# Patient Record
Sex: Male | Born: 1965 | Race: White | Hispanic: No | Marital: Married | Smoking: Former smoker
Health system: Southern US, Community
[De-identification: ages and names within clinical notes are randomized; demographics above are authoritative.]

## PROBLEM LIST (undated history)

## (undated) DIAGNOSIS — J189 Pneumonia, unspecified organism: Secondary | ICD-10-CM

## (undated) DIAGNOSIS — M1990 Unspecified osteoarthritis, unspecified site: Secondary | ICD-10-CM

## (undated) DIAGNOSIS — I10 Essential (primary) hypertension: Secondary | ICD-10-CM

## (undated) DIAGNOSIS — E039 Hypothyroidism, unspecified: Secondary | ICD-10-CM

## (undated) HISTORY — PX: HAND TENDON SURGERY: SHX663

## (undated) HISTORY — PX: WISDOM TOOTH EXTRACTION: SHX21

---

## 2003-05-05 ENCOUNTER — Encounter: Payer: Self-pay | Admitting: Family Medicine

## 2003-05-05 ENCOUNTER — Ambulatory Visit (HOSPITAL_COMMUNITY): Admission: RE | Admit: 2003-05-05 | Discharge: 2003-05-05 | Payer: Self-pay | Admitting: Family Medicine

## 2004-07-19 ENCOUNTER — Emergency Department (HOSPITAL_COMMUNITY): Admission: EM | Admit: 2004-07-19 | Discharge: 2004-07-19 | Payer: Self-pay

## 2011-08-11 ENCOUNTER — Encounter: Payer: Self-pay | Admitting: Family Medicine

## 2011-08-11 ENCOUNTER — Emergency Department (HOSPITAL_BASED_OUTPATIENT_CLINIC_OR_DEPARTMENT_OTHER)
Admission: EM | Admit: 2011-08-11 | Discharge: 2011-08-11 | Disposition: A | Discharge status: Home / Self Care | Payer: 59 | Attending: Emergency Medicine | Admitting: Emergency Medicine

## 2011-08-11 ENCOUNTER — Emergency Department (INDEPENDENT_AMBULATORY_CARE_PROVIDER_SITE_OTHER): Payer: 59

## 2011-08-11 ENCOUNTER — Other Ambulatory Visit: Payer: Self-pay

## 2011-08-11 DIAGNOSIS — R002 Palpitations: Secondary | ICD-10-CM

## 2011-08-11 DIAGNOSIS — R05 Cough: Secondary | ICD-10-CM

## 2011-08-11 DIAGNOSIS — E079 Disorder of thyroid, unspecified: Secondary | ICD-10-CM | POA: Insufficient documentation

## 2011-08-11 DIAGNOSIS — I499 Cardiac arrhythmia, unspecified: Secondary | ICD-10-CM | POA: Insufficient documentation

## 2011-08-11 HISTORY — DX: Hypothyroidism, unspecified: E03.9

## 2011-08-11 LAB — BASIC METABOLIC PANEL
BUN: 12 mg/dL (ref 6–23)
CO2: 23 mEq/L (ref 19–32)
Calcium: 9.5 mg/dL (ref 8.4–10.5)
Chloride: 105 mEq/L (ref 96–112)
Creatinine, Ser: 0.8 mg/dL (ref 0.50–1.35)
GFR calc Af Amer: >90 mL/min (ref >90)
Glucose, Bld: 92 mg/dL (ref 70–99)
Potassium: 4.1 mEq/L (ref 3.5–5.1)
Sodium: 139 mEq/L (ref 135–145)

## 2011-08-11 NOTE — ED Provider Notes (Signed)
History     CSN: [id number] Arrival date & time: 08/11/2011  8:29 AM   First MD Initiated Contact with Patient 08/11/11 [phone]      Chief Complaint  Patient presents with  . Irregular Heart Beat    (Consider location/radiation/quality/duration/timing/severity/associated sxs/prior treatment) Patient is a 45 y.o. male presenting with palpitations.  Palpitations  This is a new problem. The current episode started yesterday. The problem occurs constantly. The problem has been rapidly improving. The problem is associated with an unknown factor. Associated symptoms include irregular heartbeat and cough. Pertinent negatives include no fever, no chest pain, no chest pressure, no orthopnea, no syncope, no nausea, no back pain, no leg pain, no shortness of breath and no sputum production. He has tried nothing for the symptoms. Risk factors include no known risk factors. Past medical history comments: hypothyroidism.    Past Medical History  Diagnosis Date  . Hypothyroid     History reviewed. No pertinent past surgical history.  No family history on file.  History  Substance Use Topics  . Smoking status: Never Smoker   . Smokeless tobacco: Not on file  . Alcohol Use: Yes      Review of Systems  Constitutional: Negative for fever.  Respiratory: Positive for cough. Negative for sputum production and shortness of breath.   Cardiovascular: Positive for palpitations. Negative for chest pain, orthopnea and syncope.  Gastrointestinal: Negative for nausea.  Musculoskeletal: Negative for back pain.  All other systems reviewed and are negative.    Allergies  Review of patient's allergies indicates no known allergies.  Home Medications   Current Outpatient Rx  Name Route Sig Dispense Refill  . LEVOTHYROXINE SODIUM 200 MCG PO TABS Oral Take 200 mcg by mouth daily.      Marland Kitchen ONE-DAILY MULTI VITAMINS PO TABS Oral Take 1 tablet by mouth daily.        BP 141/101  Pulse 82  Temp(Src) 98.5  F (36.9 C) (Oral)  Resp 16  Ht 5\' 8"  (1.727 m)  Wt 200 lb (90.719 kg)  BMI 30.41 kg/m2  SpO2 99%  Physical Exam  Nursing note and vitals reviewed. Constitutional: He is oriented to person, place, and time. He appears well-developed and well-nourished. No distress.  HENT:  Head: Normocephalic and atraumatic.  Mouth/Throat: Oropharynx is clear and moist.  Eyes: Conjunctivae and EOM are normal. Pupils are equal, round, and reactive to light.  Neck: Normal range of motion. Neck supple.  Cardiovascular: Normal rate, regular rhythm and intact distal pulses.   No murmur heard. Pulmonary/Chest: Effort normal and breath sounds normal. No respiratory distress. He has no wheezes. He has no rales.  Abdominal: Soft. He exhibits no distension. There is no tenderness. There is no rebound and no guarding.  Musculoskeletal: Normal range of motion. He exhibits no edema and no tenderness.  Neurological: He is alert and oriented to person, place, and time.  Skin: Skin is warm and dry. No rash noted. No erythema.  Psychiatric: He has a normal mood and affect. His behavior is normal.    ED Course  Procedures (including critical care time)   Labs Reviewed  CARDIAC PANEL(CRET KIN+CKTOT+MB+TROPI)  BASIC METABOLIC PANEL   Dg Chest 2 View  08/11/2011  *RADIOLOGY REPORT*  Clinical Data: Cough, palpitations  CHEST - 2 VIEW  Comparison: None.  Findings: No active infiltrate or effusion is seen.  Minimal peribronchial thickening is noted.  Mediastinal contours appear normal.  The heart is within normal limits in size.  No  bony abnormality is seen.  IMPRESSION: No active lung disease.  Minimal peribronchial thickening.  Original Report Authenticated By: Juline Patch, M.D.     No diagnosis found.   Date: 08/11/2011  Rate: 77  Rhythm: normal sinus rhythm  QRS Axis: normal  Intervals: normal  ST/T Wave abnormalities: normal  Conduction Disutrbances:none  Narrative Interpretation:   Old EKG Reviewed:  none available     MDM   Pt with sensation of irregular heart beat since last night.  States earlier his heart was also beating very fast but now just still feels irregular.  Pt denies any CP, SOB or other associated sx.  States had URI over a week ago and now just has a very mild cough.  Denies excessive caffiene use or drug use.  Denies cold medication use and synthroid at same dose for the last 1year.  However TSH last checked 81mo ago.  Here VS normal and exam normal.  Pulse is regular and EKG wnl without any signs of dysrrhythmia.  No cardiac risk factors and pt o/w well appearing.  Will check BMP, CE and CXR to r/o other issues.  However discussed with pt f/u with PCP for holter monitor and TSH recheck.  Labs wnl.  While on monitor no arrhythmias      Gwyneth Sprout, MD 08/11/11 1014

## 2011-08-11 NOTE — ED Notes (Signed)
MD at bedside. 

## 2011-08-11 NOTE — ED Notes (Signed)
Pt sts he feels "good'.

## 2011-08-11 NOTE — ED Notes (Signed)
Pt sts he was watching tv and "heart felt irregular". Pt sts rate "felt normal but just irregular". Pt sts "it still feels irregular".  Pt denies hx of same. Pt denies pain, shob, n/v, diaphoresis.

## 2015-06-11 ENCOUNTER — Encounter (HOSPITAL_BASED_OUTPATIENT_CLINIC_OR_DEPARTMENT_OTHER): Payer: Self-pay | Admitting: *Deleted

## 2015-06-11 ENCOUNTER — Emergency Department (HOSPITAL_BASED_OUTPATIENT_CLINIC_OR_DEPARTMENT_OTHER): Payer: BLUE CROSS/BLUE SHIELD

## 2015-06-11 ENCOUNTER — Emergency Department (HOSPITAL_BASED_OUTPATIENT_CLINIC_OR_DEPARTMENT_OTHER)
Admission: EM | Admit: 2015-06-11 | Discharge: 2015-06-11 | Disposition: A | Discharge status: Home / Self Care | Payer: BLUE CROSS/BLUE SHIELD | Attending: Emergency Medicine | Admitting: Emergency Medicine

## 2015-06-11 DIAGNOSIS — S8992XA Unspecified injury of left lower leg, initial encounter: Secondary | ICD-10-CM | POA: Diagnosis present

## 2015-06-11 DIAGNOSIS — E039 Hypothyroidism, unspecified: Secondary | ICD-10-CM | POA: Insufficient documentation

## 2015-06-11 DIAGNOSIS — W228XXA Striking against or struck by other objects, initial encounter: Secondary | ICD-10-CM | POA: Insufficient documentation

## 2015-06-11 DIAGNOSIS — S82092A Other fracture of left patella, initial encounter for closed fracture: Secondary | ICD-10-CM | POA: Insufficient documentation

## 2015-06-11 DIAGNOSIS — S76112A Strain of left quadriceps muscle, fascia and tendon, initial encounter: Secondary | ICD-10-CM | POA: Diagnosis not present

## 2015-06-11 DIAGNOSIS — Y9289 Other specified places as the place of occurrence of the external cause: Secondary | ICD-10-CM | POA: Insufficient documentation

## 2015-06-11 DIAGNOSIS — Z23 Encounter for immunization: Secondary | ICD-10-CM | POA: Diagnosis not present

## 2015-06-11 DIAGNOSIS — Z79899 Other long term (current) drug therapy: Secondary | ICD-10-CM | POA: Diagnosis not present

## 2015-06-11 DIAGNOSIS — S82002A Unspecified fracture of left patella, initial encounter for closed fracture: Secondary | ICD-10-CM

## 2015-06-11 DIAGNOSIS — Y998 Other external cause status: Secondary | ICD-10-CM | POA: Diagnosis not present

## 2015-06-11 DIAGNOSIS — Y9339 Activity, other involving climbing, rappelling and jumping off: Secondary | ICD-10-CM | POA: Diagnosis not present

## 2015-06-11 MED ORDER — TETANUS-DIPHTH-ACELL PERTUSSIS 5-2.5-18.5 LF-MCG/0.5 IM SUSP
0.5000 mL | Freq: Once | INTRAMUSCULAR | Status: AC
  Administered 2015-06-11: 0.5 mL via INTRAMUSCULAR
  Filled 2015-06-11: qty 0.5

## 2015-06-11 MED ORDER — HYDROCODONE-ACETAMINOPHEN 5-325 MG PO TABS: 1.0000 | ORAL_TABLET | ORAL | Status: DC | PRN

## 2015-06-11 NOTE — Discharge Instructions (Signed)
Patellar Fracture, Adult Call Dr. Lajoyce Corners for an appointemnt tomorrow. Return to the ED if you develop new or worsening symptoms. A patellar fracture is a break in your kneecap (patella).  CAUSES   A direct blow to the knee or a fall is usually the cause of a broken patella.  A very hard and strong bending of your knee can cause a patellar fracture. RISK FACTORS Involvement in contact sports, especially sports that involve a lot of jumping. SIGNS AND SYMPTOMS   Tender and swollen knee.  Pain when you move your knee, especially when you try to straighten out your leg.  Difficulty walking or putting weight on your knee.  Misshapen knee (as if a bone is out of place). DIAGNOSIS  Patellar fracture is usually diagnosed with a physical exam and an X-ray exam. TREATMENT  Treatment depends on the type of fracture:  If your patella is still in the right position after the fracture and you can still straighten your leg out, you can usually be treated with a splint or cast for 4-6 weeks.  If your patella is broken into multiple small pieces but you are able to straighten your leg, you can usually be treated with a splint or cast for 4-6 weeks. Sometimes your patella may need to be removed before the cast is applied.  If you cannot straighten out your leg after a patellar fracture, then surgery is required to hold the bony fragments together until they heal. A cast or splint will be applied for 4-6 weeks. HOME CARE INSTRUCTIONS   Only take over-the-counter or prescription medicines for pain, discomfort, or fever as directed by your health care provider.  Use crutches as directed, and exercise the leg as directed.  Apply ice to the injured area:  Put ice in a plastic bag.  Place a towel between your skin and the bag.  Leave the ice on for 20 minutes, 2-3 times a day.  Elevate the affected knee above the level of your heart. SEEK MEDICAL CARE IF:  You suspect you have significantly  injured your knee.  You hear a pop after a knee injury.  Your knee is misshapen after a knee injury.  You have pain when you move your knee.  You have difficulty walking or putting weight on your knee.  You cannot fully move your knee. SEEK IMMEDIATE MEDICAL CARE IF:  You have redness, swelling, or increasing pain in your knee.  You have a fever. Document Released: 06/21/2003 Document Revised: 07/13/2013 Document Reviewed: 05/04/2013 [facility] Patient Information 2015 [place], [place]. This information is not intended to replace advice given to you by your health care provider. Make sure you discuss any questions you have with your health care provider.

## 2015-06-11 NOTE — ED Notes (Signed)
MD at bedside. 

## 2015-06-11 NOTE — ED Provider Notes (Signed)
CSN: [id number]     Arrival date & time 06/11/15  1553 History  This chart was scribed for Tyler Octave, MD by Murriel Hopper, ED Scribe. This patient was seen in room MH10/MH10 and the patient's care was started at 4:19 PM.    Chief Complaint  Patient presents with  . Knee Injury     The history is provided by the patient. No language interpreter was used.     HPI Comments: Tyler [name] is a 49 y.o. male who presents to the Emergency Department complaining of a knee injury that occurred immediately PTA. Pt states he was jumping off of a diving board, and as he came back down for his second jump on the end of the board, his left leg gave out and he hit his left shin on the end of the board as he went into the water. Pt reports with a bleeding laceration on his left shin currently. Pt states he had to be helped out from the water by a lifeguard, and states when he tried to walk when he was on land his left knee buckled. Pt denies current left knee pain, denies hitting his head, or LOC during the jump.   Past Medical History  Diagnosis Date  . Hypothyroid    History reviewed. No pertinent past surgical history. No family history on file. Social History  Substance Use Topics  . Smoking status: Never Smoker   . Smokeless tobacco: Never Used  . Alcohol Use: Yes     Comment: 6 drinks/week    Review of Systems  A complete 10 system review of systems was obtained and all systems are negative except as noted in the HPI and PMH.    Allergies  Review of patient's allergies indicates no known allergies.  Home Medications   Prior to Admission medications   Medication Sig Start Date End Date Taking? Authorizing Provider  levothyroxine (SYNTHROID, LEVOTHROID) 200 MCG tablet Take 200 mcg by mouth daily.     Yes Historical Provider, MD  HYDROcodone-acetaminophen (NORCO/VICODIN) 5-325 MG per tablet Take 1 tablet by mouth every 4 (four) hours as needed. 06/11/15   Tyler Octave, MD   Multiple Vitamin (MULTIVITAMIN) tablet Take 1 tablet by mouth daily.      Historical Provider, MD   BP 96/64 mmHg  Pulse 70  Temp(Src) 97.7 F (36.5 C) (Oral)  Resp 18  Ht 5\' 8"  (1.727 m)  Wt 210 lb (95.255 kg)  BMI 31.94 kg/m2  SpO2 95% Physical Exam  Constitutional: He is oriented to person, place, and time. He appears well-developed and well-nourished. No distress.  HENT:  Head: Normocephalic and atraumatic.  Mouth/Throat: Oropharynx is clear and moist. No oropharyngeal exudate.  Eyes: Conjunctivae and EOM are normal. Pupils are equal, round, and reactive to light.  Neck: Normal range of motion. Neck supple.  No meningismus.  Cardiovascular: Normal rate, regular rhythm, normal heart sounds and intact distal pulses.   No murmur heard. Pulmonary/Chest: Effort normal and breath sounds normal. No respiratory distress.  Abdominal: Soft. There is no tenderness. There is no rebound and no guarding.  Musculoskeletal: Normal range of motion. He exhibits no edema or tenderness.  Palpable deformity to quadriceps tendon Unable to straight leg raise Weakness with knee extension Puncture wound and abrasion to left shin Intact DP and PT pulses  Neurological: He is alert and oriented to person, place, and time. No cranial nerve deficit. He exhibits normal muscle tone. Coordination normal.  No ataxia on finger to  nose bilaterally. No pronator drift. 5/5 strength throughout. CN 2-12 intact. Equal grip strength. Sensation intact.   Skin: Skin is warm.  Psychiatric: He has a normal mood and affect. His behavior is normal.  Nursing note and vitals reviewed.   ED Course  Procedures (including critical care time)  DIAGNOSTIC STUDIES: Oxygen Saturation is 95% on room air, normal by my interpretation.    COORDINATION OF CARE: 4:25 PM Discussed treatment plan with pt at bedside and pt agreed to plan.  5:55 PM X-rays read, and results discussed with pt. Pt referred to orthopedic specialist  Dr. Lajoyce Corners. Pt will be scheduled for office visit and surgery tomorrow morning when he calls their office.   Labs Review Labs Reviewed - No data to display  Imaging Review Dg Tibia/fibula Left  06/11/2015   ADDENDUM REPORT: 06/11/2015 17:44  ADDENDUM: Not mentioned above is a partially visualized well corticated osseous fragment along the superolateral aspect of the patella likely reflecting sequela prior avulsive injury.   Electronically Signed   By: Elige Ko   On: 06/11/2015 17:44   06/11/2015   CLINICAL DATA:  Left lower leg pain with anterior laceration midway down the leg  EXAM: LEFT TIBIA AND FIBULA - 2 VIEW  COMPARISON:  None.  FINDINGS: There is no evidence of fracture or other focal bone lesions. Soft tissues are unremarkable.  IMPRESSION: No acute osseous injury of the left lower leg.  Electronically Signed: By: Elige Ko On: 06/11/2015 17:05   Dg Knee Complete 4 Views Left  06/11/2015   CLINICAL DATA:  Patient was jumping on a diving board and came down on the left knee. Knee gave out. Anterior pain. Unable to bear weight or bend knee. History of anterior knee surgery 10 years ago.  EXAM: LEFT KNEE - COMPLETE 4+ VIEW  COMPARISON:  06/11/2015 tib-fib  FINDINGS: There is a chronic fracture of the superior pole of the patella with proximal displacement of the proximal fracture fragment. There is edema within the anterior aspect of the knee. Small joint effusion is present. No evidence for acute fracture or dislocation.  IMPRESSION: Chronic fracture the patella.  No evidence for acute  abnormality.   Electronically Signed   By: Norva Pavlov M.D.   On: 06/11/2015 17:36   I have personally reviewed and evaluated these images and lab results as part of my medical decision-making.   EKG Interpretation None      MDM   Final diagnoses:  Patella fracture, left, closed, initial encounter  Quadriceps tendon rupture, left, initial encounter   patient slipped on diving board and landed on  left knee. Did not hit head or lose consciousness.  Patient on exam has quadriceps tendon rupture with abrasion to lower leg. Neurovascularly intact.  X-ray shows patellar fracture. Read by radiology as chronic but patient denies any previous injury. Patella fracture with posterior minus quadriceps tendon rupture.  Discussed with Dr. Lajoyce Corners orthopedics. He states treatment is the same whether there is tendon rupture or just patellar rupture. Knee immobilizer, crutches, weightbearing as tolerated. Patient to call his office in the morning to schedule an office visit for surgical repair.  I personally performed the services described in this documentation, which was scribed in my presence. The recorded information has been reviewed and is accurate.   Tyler Octave, MD 06/11/15 [phone]

## 2015-06-11 NOTE — ED Notes (Signed)
Rx x 1 given for vicodin. D/c home with family to drive. Verbalizes understanding to call Dr Lajoyce Corners in the morning for an appt tomorrow. Crutches and ice pack given for home use

## 2015-06-11 NOTE — ED Notes (Signed)
EMS report patient was diving on a board and his leg slipped off of the board, causing his left leg to buckle, left shin hit the diving board causing him to hit his shin on the board. No loc.   IV # 18 G in left AC and was given of Fentanyl, ice applied to knee, VS: HR 80, BP 113/76, RR 16.  Pt consumed 4 12oz beers pta.

## 2015-06-12 ENCOUNTER — Other Ambulatory Visit (HOSPITAL_COMMUNITY): Payer: Self-pay | Admitting: Orthopedic Surgery

## 2015-06-12 ENCOUNTER — Other Ambulatory Visit (HOSPITAL_COMMUNITY): Payer: Self-pay | Admitting: *Deleted

## 2015-06-12 ENCOUNTER — Encounter (HOSPITAL_COMMUNITY): Payer: Self-pay | Admitting: *Deleted

## 2015-06-13 ENCOUNTER — Ambulatory Visit (HOSPITAL_COMMUNITY)
Admission: RE | Admit: 2015-06-13 | Discharge: 2015-06-13 | Disposition: A | Discharge status: Home / Self Care | Payer: BLUE CROSS/BLUE SHIELD | Source: Ambulatory Visit | Attending: Orthopedic Surgery | Admitting: Orthopedic Surgery

## 2015-06-13 ENCOUNTER — Ambulatory Visit (HOSPITAL_COMMUNITY): Payer: BLUE CROSS/BLUE SHIELD | Admitting: Certified Registered Nurse Anesthetist

## 2015-06-13 ENCOUNTER — Encounter (HOSPITAL_COMMUNITY)
Admission: RE | Disposition: A | Discharge status: Home / Self Care | Payer: Self-pay | Source: Ambulatory Visit | Attending: Orthopedic Surgery

## 2015-06-13 DIAGNOSIS — X58XXXA Exposure to other specified factors, initial encounter: Secondary | ICD-10-CM | POA: Diagnosis not present

## 2015-06-13 DIAGNOSIS — E039 Hypothyroidism, unspecified: Secondary | ICD-10-CM | POA: Insufficient documentation

## 2015-06-13 DIAGNOSIS — Y9339 Activity, other involving climbing, rappelling and jumping off: Secondary | ICD-10-CM | POA: Diagnosis not present

## 2015-06-13 DIAGNOSIS — Y92838 Other recreation area as the place of occurrence of the external cause: Secondary | ICD-10-CM | POA: Insufficient documentation

## 2015-06-13 DIAGNOSIS — S82002A Unspecified fracture of left patella, initial encounter for closed fracture: Secondary | ICD-10-CM | POA: Insufficient documentation

## 2015-06-13 DIAGNOSIS — Z87891 Personal history of nicotine dependence: Secondary | ICD-10-CM | POA: Diagnosis not present

## 2015-06-13 DIAGNOSIS — S76112A Strain of left quadriceps muscle, fascia and tendon, initial encounter: Secondary | ICD-10-CM | POA: Diagnosis present

## 2015-06-13 DIAGNOSIS — W228XXA Striking against or struck by other objects, initial encounter: Secondary | ICD-10-CM | POA: Diagnosis not present

## 2015-06-13 HISTORY — DX: Pneumonia, unspecified organism: J18.9

## 2015-06-13 HISTORY — PX: QUADRICEPS TENDON REPAIR: SHX756

## 2015-06-13 LAB — CBC
HCT: 41.5 % (ref 39.0–52.0)
HEMOGLOBIN: 13.9 g/dL (ref 13.0–17.0)
MCH: 30.6 pg (ref 26.0–34.0)
MCHC: 33.5 g/dL (ref 30.0–36.0)
MCV: 91.4 fL (ref 78.0–100.0)
Platelets: 194 10*3/uL (ref 150–400)
RBC: 4.54 MIL/uL (ref 4.22–5.81)
RDW: 13 % (ref 11.5–15.5)
WBC: 5.8 10*3/uL (ref 4.0–10.5)

## 2015-06-13 LAB — COMPREHENSIVE METABOLIC PANEL
ALBUMIN: 3.6 g/dL (ref 3.5–5.0)
ALT: 34 U/L (ref 17–63)
ANION GAP: 9 (ref 5–15)
AST: 30 U/L (ref 15–41)
Alkaline Phosphatase: 44 U/L (ref 38–126)
BUN: 9 mg/dL (ref 6–20)
CHLORIDE: 104 mmol/L (ref 101–111)
CO2: 24 mmol/L (ref 22–32)
Calcium: 9 mg/dL (ref 8.9–10.3)
Creatinine, Ser: 0.89 mg/dL (ref 0.61–1.24)
GFR calc Af Amer: >60 mL/min (ref >60)
GFR calc non Af Amer: >60 mL/min (ref >60)
GLUCOSE: 85 mg/dL (ref 65–99)
POTASSIUM: 3.7 mmol/L (ref 3.5–5.1)
SODIUM: 137 mmol/L (ref 135–145)
Total Bilirubin: 1.2 mg/dL (ref 0.3–1.2)
Total Protein: 6.5 g/dL (ref 6.5–8.1)

## 2015-06-13 LAB — APTT: APTT: 25 s (ref 24–37)

## 2015-06-13 LAB — PROTIME-INR
INR: 0.95 (ref 0.00–1.49)
Prothrombin Time: 12.9 seconds (ref 11.6–15.2)

## 2015-06-13 SURGERY — REPAIR, TENDON, QUADRICEPS
Anesthesia: Regional | Site: Thigh | Laterality: Left

## 2015-06-13 MED ORDER — LACTATED RINGERS IV SOLN
INTRAVENOUS | Status: DC | PRN
  Administered 2015-06-13: 17:00:00 via INTRAVENOUS

## 2015-06-13 MED ORDER — CHLORHEXIDINE GLUCONATE 4 % EX LIQD: 60.0000 mL | Freq: Once | CUTANEOUS | Status: DC

## 2015-06-13 MED ORDER — FENTANYL CITRATE (PF) 250 MCG/5ML IJ SOLN
INTRAMUSCULAR | Status: AC
  Filled 2015-06-13: qty 5

## 2015-06-13 MED ORDER — MIDAZOLAM HCL 2 MG/2ML IJ SOLN
INTRAMUSCULAR | Status: AC
  Administered 2015-06-13: 2 mg
  Filled 2015-06-13: qty 2

## 2015-06-13 MED ORDER — FENTANYL CITRATE (PF) 100 MCG/2ML IJ SOLN
100.0000 ug | Freq: Once | INTRAMUSCULAR | Status: AC
  Administered 2015-06-13: 100 ug via INTRAVENOUS

## 2015-06-13 MED ORDER — BUPIVACAINE-EPINEPHRINE (PF) 0.5% -1:200000 IJ SOLN
INTRAMUSCULAR | Status: DC | PRN
  Administered 2015-06-13: 30 mL via PERINEURAL

## 2015-06-13 MED ORDER — PROMETHAZINE HCL 25 MG/ML IJ SOLN: 6.2500 mg | INTRAMUSCULAR | Status: DC | PRN

## 2015-06-13 MED ORDER — HYDROMORPHONE HCL 1 MG/ML IJ SOLN: 0.2500 mg | INTRAMUSCULAR | Status: DC | PRN

## 2015-06-13 MED ORDER — MIDAZOLAM HCL 5 MG/ML IJ SOLN: 2.0000 mg | Freq: Once | INTRAMUSCULAR | Status: AC

## 2015-06-13 MED ORDER — CEFAZOLIN SODIUM-DEXTROSE 2-3 GM-% IV SOLR
2.0000 g | INTRAVENOUS | Status: AC
  Administered 2015-06-13: 2 g via INTRAVENOUS
  Filled 2015-06-13: qty 50

## 2015-06-13 MED ORDER — FENTANYL CITRATE (PF) 100 MCG/2ML IJ SOLN
INTRAMUSCULAR | Status: DC | PRN
  Administered 2015-06-13: 50 ug via INTRAVENOUS

## 2015-06-13 MED ORDER — PROPOFOL 10 MG/ML IV BOLUS
INTRAVENOUS | Status: DC | PRN
  Administered 2015-06-13 (×2): 50 mg via INTRAVENOUS
  Administered 2015-06-13: 100 mg via INTRAVENOUS

## 2015-06-13 MED ORDER — ONDANSETRON HCL 4 MG/2ML IJ SOLN
INTRAMUSCULAR | Status: DC | PRN
  Administered 2015-06-13: 4 mg via INTRAVENOUS

## 2015-06-13 MED ORDER — PROPOFOL 10 MG/ML IV BOLUS
INTRAVENOUS | Status: AC
  Filled 2015-06-13: qty 20

## 2015-06-13 MED ORDER — FENTANYL CITRATE (PF) 100 MCG/2ML IJ SOLN
INTRAMUSCULAR | Status: AC
  Administered 2015-06-13: 100 ug via INTRAVENOUS
  Filled 2015-06-13: qty 2

## 2015-06-13 MED ORDER — MIDAZOLAM HCL 2 MG/2ML IJ SOLN
INTRAMUSCULAR | Status: AC
  Filled 2015-06-13: qty 4

## 2015-06-13 MED ORDER — MEPERIDINE HCL 25 MG/ML IJ SOLN: 6.2500 mg | INTRAMUSCULAR | Status: DC | PRN

## 2015-06-13 MED ORDER — 0.9 % SODIUM CHLORIDE (POUR BTL) OPTIME
TOPICAL | Status: DC | PRN
  Administered 2015-06-13: 1000 mL

## 2015-06-13 SURGICAL SUPPLY — 50 items
BLADE SURG 10 STRL SS (BLADE) ×3 IMPLANT
BNDG COHESIVE 4X5 TAN STRL (GAUZE/BANDAGES/DRESSINGS) ×3 IMPLANT
BNDG GAUZE ELAST 4 BULKY (GAUZE/BANDAGES/DRESSINGS) ×3 IMPLANT
COVER MAYO STAND STRL (DRAPES) ×1 IMPLANT
COVER SURGICAL LIGHT HANDLE (MISCELLANEOUS) ×3 IMPLANT
DRAPE INCISE IOBAN 66X45 STRL (DRAPES) ×3 IMPLANT
DRAPE U-SHAPE 47X51 STRL (DRAPES) ×3 IMPLANT
DRSG ADAPTIC 3X8 NADH LF (GAUZE/BANDAGES/DRESSINGS) ×3 IMPLANT
DRSG PAD ABDOMINAL 8X10 ST (GAUZE/BANDAGES/DRESSINGS) ×4 IMPLANT
DURAPREP 26ML APPLICATOR (WOUND CARE) ×3 IMPLANT
ELECT REM PT RETURN 9FT ADLT (ELECTROSURGICAL) ×3
ELECTRODE REM PT RTRN 9FT ADLT (ELECTROSURGICAL) ×1 IMPLANT
GAUZE SPONGE 4X4 12PLY STRL (GAUZE/BANDAGES/DRESSINGS) ×3 IMPLANT
GLOVE BIOGEL PI IND STRL 6.5 (GLOVE) IMPLANT
GLOVE BIOGEL PI IND STRL 7.5 (GLOVE) IMPLANT
GLOVE BIOGEL PI IND STRL 8.5 (GLOVE) ×1 IMPLANT
GLOVE BIOGEL PI INDICATOR 6.5 (GLOVE) ×2
GLOVE BIOGEL PI INDICATOR 7.5 (GLOVE) ×2
GLOVE BIOGEL PI INDICATOR 8.5 (GLOVE) ×4
GLOVE SURG ORTHO 9.0 STRL STRW (GLOVE) ×3 IMPLANT
GLOVE SURG SS PI 6.5 STRL IVOR (GLOVE) ×4 IMPLANT
GOWN STRL REUS W/ TWL LRG LVL3 (GOWN DISPOSABLE) IMPLANT
GOWN STRL REUS W/ TWL XL LVL3 (GOWN DISPOSABLE) ×1 IMPLANT
GOWN STRL REUS W/TWL LRG LVL3 (GOWN DISPOSABLE) ×6
GOWN STRL REUS W/TWL XL LVL3 (GOWN DISPOSABLE) ×3
KIT BASIN OR (CUSTOM PROCEDURE TRAY) ×3 IMPLANT
KIT ROOM TURNOVER OR (KITS) ×3 IMPLANT
MANIFOLD NEPTUNE II (INSTRUMENTS) ×3 IMPLANT
NDL SUT 2 .5 CRC MAYO 1.732X (NEEDLE) IMPLANT
NEEDLE MAYO TAPER (NEEDLE) ×3
NS IRRIG 1000ML POUR BTL (IV SOLUTION) ×3 IMPLANT
PACK ORTHO EXTREMITY (CUSTOM PROCEDURE TRAY) ×3 IMPLANT
PAD ARMBOARD 7.5X6 YLW CONV (MISCELLANEOUS) ×6 IMPLANT
RETRIEVER SUT HEWSON (MISCELLANEOUS) ×3 IMPLANT
SPONGE GAUZE 4X4 12PLY STER LF (GAUZE/BANDAGES/DRESSINGS) ×2 IMPLANT
SPONGE LAP 18X18 X RAY DECT (DISPOSABLE) ×6 IMPLANT
STAPLER VISISTAT 35W (STAPLE) ×3 IMPLANT
STOCKINETTE IMPERVIOUS 9X36 MD (GAUZE/BANDAGES/DRESSINGS) ×5 IMPLANT
SUT ETHILON 2 0 FS 18 (SUTURE) ×2 IMPLANT
SUT ETHILON 2 0 PSLX (SUTURE) ×2 IMPLANT
SUT FIBERWIRE #2 38 T-5 BLUE (SUTURE) ×9
SUT VIC AB 0 CT1 27 (SUTURE) ×3
SUT VIC AB 0 CT1 27XBRD ANBCTR (SUTURE) ×1 IMPLANT
SUTURE FIBERWR #2 38 T-5 BLUE (SUTURE) ×2 IMPLANT
TOWEL OR 17X24 6PK STRL BLUE (TOWEL DISPOSABLE) ×3 IMPLANT
TOWEL OR 17X26 10 PK STRL BLUE (TOWEL DISPOSABLE) ×3 IMPLANT
TUBE CONNECTING 12'X1/4 (SUCTIONS) ×1
TUBE CONNECTING 12X1/4 (SUCTIONS) ×2 IMPLANT
TUBE ENDOTRAC EMG 7X10.2 (MISCELLANEOUS) ×3 IMPLANT
YANKAUER SUCT BULB TIP NO VENT (SUCTIONS) ×3 IMPLANT

## 2015-06-13 NOTE — Anesthesia Preprocedure Evaluation (Signed)
Anesthesia Evaluation  Patient identified by MRN, date of birth, ID band Patient awake    Reviewed: Allergy & Precautions, NPO status , Patient's Chart, lab work & pertinent test results  Airway Mallampati: II  TM Distance: >3 FB Neck ROM: Full    Dental no notable dental hx.    Pulmonary neg pulmonary ROS, former smoker,    Pulmonary exam normal breath sounds clear to auscultation       Cardiovascular negative cardio ROS Normal cardiovascular exam Rhythm:Regular Rate:Normal     Neuro/Psych negative neurological ROS  negative psych ROS   GI/Hepatic negative GI ROS, Neg liver ROS,   Endo/Other  Hypothyroidism   Renal/GU negative Renal ROS  negative genitourinary   Musculoskeletal negative musculoskeletal ROS (+)   Abdominal   Peds negative pediatric ROS (+)  Hematology negative hematology ROS (+)   Anesthesia Other Findings   Reproductive/Obstetrics negative OB ROS                             Anesthesia Physical Anesthesia Plan  ASA: II  Anesthesia Plan: General and Regional   Post-op Pain Management:    Induction: Intravenous  Airway Management Planned: LMA  Additional Equipment:   Intra-op Plan:   Post-operative Plan: Extubation in OR  Informed Consent: I have reviewed the patients History and Physical, chart, labs and discussed the procedure including the risks, benefits and alternatives for the proposed anesthesia with the patient or authorized representative who has indicated his/her understanding and acceptance.   Dental advisory given  Plan Discussed with: CRNA  Anesthesia Plan Comments:         Anesthesia Quick Evaluation

## 2015-06-13 NOTE — H&P (Signed)
Tyler [name] is an 49 y.o. male.   Chief Complaint: Left quad tendon rupture with a avulsion of a secondary ossification center of the patella HPI: Patient is a 49 year old gentleman who sustained an acute quadriceps tendon rupture with avulsion of the secondary ossification center of the left patella  Past Medical History  Diagnosis Date  . Hypothyroid   . Pneumonia     Past Surgical History  Procedure Laterality Date  . Hand tendon surgery Left     Family History  Problem Relation Age of Onset  . Heart attack Father    Social History:  reports that he quit smoking about 20 years ago. He has never used smokeless tobacco. He reports that he drinks alcohol. He reports that he does not use illicit drugs.  Allergies: No Known Allergies  No prescriptions prior to admission    No results found for this or any previous visit (from the past 48 hour(s)). Dg Tibia/fibula Left  06/11/2015   ADDENDUM REPORT: 06/11/2015 17:44  ADDENDUM: Not mentioned above is a partially visualized well corticated osseous fragment along the superolateral aspect of the patella likely reflecting sequela prior avulsive injury.   Electronically Signed   By: Tyler [name]   On: 06/11/2015 17:44   06/11/2015   CLINICAL DATA:  Left lower leg pain with anterior laceration midway down the leg  EXAM: LEFT TIBIA AND FIBULA - 2 VIEW  COMPARISON:  None.  FINDINGS: There is no evidence of fracture or other focal bone lesions. Soft tissues are unremarkable.  IMPRESSION: No acute osseous injury of the left lower leg.  Electronically Signed: By: Tyler [name] On: 06/11/2015 17:05   Dg Knee Complete 4 Views Left  06/11/2015   CLINICAL DATA:  Patient was jumping on a diving board and came down on the left knee. Knee gave out. Anterior pain. Unable to bear weight or bend knee. History of anterior knee surgery 10 years ago.  EXAM: LEFT KNEE - COMPLETE 4+ VIEW  COMPARISON:  06/11/2015 tib-fib  FINDINGS: There is a chronic fracture  of the superior pole of the patella with proximal displacement of the proximal fracture fragment. There is edema within the anterior aspect of the knee. Small joint effusion is present. No evidence for acute fracture or dislocation.  IMPRESSION: Chronic fracture the patella.  No evidence for acute  abnormality.   Electronically Signed   By: Tyler [name] M.D.   On: 06/11/2015 17:36    Review of Systems  All other systems reviewed and are negative.   There were no vitals taken for this visit. Physical Exam  On examination patient has a palpable defect he cannot do an extension of his left knee actively. Radiographs shows avulsion of the secondary ossification center the patella. Assessment/Plan Assessment left quadriceps tendon rupture with avulsion secondary ossification center the patella.  Plan: We'll plan for excision of the secondary ossification center advancement and reconstruction of the quad tendon. Risk and benefits were discussed including risk of rerupture arthritis need for additional surgery including risk of infection. Patient states he understands and wishes to proceed at this time.  [name],[name] 06/13/2015, 6:46 AM

## 2015-06-13 NOTE — Anesthesia Procedure Notes (Addendum)
Anesthesia Regional Block:  Femoral nerve block  Pre-Anesthetic Checklist: ,, timeout performed, Correct Patient, Correct Site, Correct Laterality, Correct Procedure, Correct Position, site marked, Risks and benefits discussed, Surgical consent,  Pre-op evaluation,  Post-op pain management  Laterality: Left  Prep: chloraprep       Needles:  Injection technique: Single-shot  Needle Type: Stimulator Needle - 80     Needle Length: 9cm 9 cm Needle Gauge: 22 and 22 G  Needle insertion depth: 6 cm   Additional Needles:  Procedures: ultrasound guided (picture in chart) Femoral nerve block Narrative:  Injection made incrementally with aspirations every 5 mL.  Performed by: Personally  Anesthesiologist: Lewie Loron  Additional Notes: BP cuff, EKG monitors applied. Sedation begun. Femoral artery palpated for location of nerve. After nerve location verified with U/S, anesthetic injected incrementally, slowly, and after negative aspirations under direct u/s guidance. Good perineural spread. Patient tolerated well.   Procedure Name: LMA Insertion Date/Time: 06/13/2015 5:35 PM Performed by: Adonis Housekeeper Pre-anesthesia Checklist: Patient identified, Emergency Drugs available, Suction available and Patient being monitored Patient Re-evaluated:Patient Re-evaluated prior to inductionOxygen Delivery Method: Circle system utilized LMA: LMA inserted LMA Size: 4.0 Number of attempts: 1 Placement Confirmation: positive ETCO2 and breath sounds checked- equal and bilateral Tube secured with: Tape Dental Injury: Teeth and Oropharynx as per pre-operative assessment

## 2015-06-13 NOTE — Transfer of Care (Signed)
Immediate Anesthesia Transfer of Care Note  Patient: Tyler [name]  Procedure(s) Performed: Procedure(s): REPAIR LEFT QUADRICEP TENDON (Left)  Patient Location: PACU  Anesthesia Type:General and Regional  Level of Consciousness: awake, alert , oriented and patient cooperative  Airway & Oxygen Therapy: Patient Spontanous Breathing and Patient connected to nasal cannula oxygen  Post-op Assessment: Report given to RN, Post -op Vital signs reviewed and stable, Patient moving all extremities, Patient moving all extremities X 4 and Patient able to stick tongue midline  Post vital signs: Reviewed and stable  Last Vitals:  Filed Vitals:   06/13/15 1640  BP: 111/61  Pulse: 66  Temp:   Resp: 18    Complications: No apparent anesthesia complications

## 2015-06-13 NOTE — Op Note (Signed)
06/13/2015  5:56 PM  PATIENT:  Tyler [name]    PRE-OPERATIVE DIAGNOSIS:  Left Quadriceps rupture  POST-OPERATIVE DIAGNOSIS:  Same  PROCEDURE:  REPAIR LEFT QUADRICEP TENDON  SURGEON:  Nadara Mustard, MD  PHYSICIAN ASSISTANT:None ANESTHESIA:   General  PREOPERATIVE INDICATIONS:  Tyler [name] is a  49 y.o. male with a diagnosis of Left Quadriceps rupture who failed conservative measures and elected for surgical management.    The risks benefits and alternatives were discussed with the patient preoperatively including but not limited to the risks of infection, bleeding, nerve injury, cardiopulmonary complications, the need for revision surgery, among others, and the patient was willing to proceed.  OPERATIVE IMPLANTS: #2 FiberWire 4  OPERATIVE FINDINGS: Avulsion of a secondary ossification center which was preserved and reattached  OPERATIVE PROCEDURE: Patient was brought to the operating room after undergoing a femoral block. After adequate levels anesthesia obtained patient's left lower extremity was prepped using DuraPrep draped into a sterile field. A timeout was called. A midline incision was made carried down to the patella and quadriceps tendon. Patient had complete avulsion of the quadriceps tendon had a very small secondary ossification center still attached to the tendon. This was preserved the edges were freshened. Using Krakw technique 2 #2 FiberWire sutures were woven through the proximal aspect of the quad tendon rupture. 3 drill holes were then made through the patella and the 4 strands of the FiberWire were tied at the inferior aspect of the patella. 2 additional FiberWire were then used to suture the medial and lateral aspect of the quadriceps tendon. Patient had a good stable construct. The wound was irrigated with normal saline throughout the case. The incision was closed using 2-0 nylon. A sterile compressive dressing was applied and a knee immobilizer was  placed patient was taken to the PACU in stable condition.

## 2015-06-13 NOTE — Anesthesia Postprocedure Evaluation (Signed)
Anesthesia Post Note  Patient: Tyler [name]  Procedure(s) Performed: Procedure(s) (LRB): REPAIR LEFT QUADRICEP TENDON (Left)  Anesthesia type: General  Patient location: PACU  Post pain: Pain level controlled and Adequate analgesia  Post assessment: Post-op Vital signs reviewed, Patient's Cardiovascular Status Stable, Respiratory Function Stable, Patent Airway and Pain level controlled  Last Vitals:  Filed Vitals:   06/13/15 1802  BP:   Pulse:   Temp: 36.7 C  Resp:     Post vital signs: Reviewed and stable  Level of consciousness: awake, alert  and oriented  Complications: No apparent anesthesia complications

## 2015-06-14 ENCOUNTER — Encounter (HOSPITAL_COMMUNITY): Payer: Self-pay | Admitting: Orthopedic Surgery

## 2018-03-24 ENCOUNTER — Emergency Department (HOSPITAL_BASED_OUTPATIENT_CLINIC_OR_DEPARTMENT_OTHER)
Admission: EM | Admit: 2018-03-24 | Discharge: 2018-03-24 | Disposition: A | Discharge status: Home / Self Care | Payer: BLUE CROSS/BLUE SHIELD | Attending: Emergency Medicine | Admitting: Emergency Medicine

## 2018-03-24 ENCOUNTER — Other Ambulatory Visit: Payer: Self-pay

## 2018-03-24 ENCOUNTER — Emergency Department (HOSPITAL_BASED_OUTPATIENT_CLINIC_OR_DEPARTMENT_OTHER): Payer: BLUE CROSS/BLUE SHIELD

## 2018-03-24 ENCOUNTER — Encounter (HOSPITAL_BASED_OUTPATIENT_CLINIC_OR_DEPARTMENT_OTHER): Payer: Self-pay

## 2018-03-24 DIAGNOSIS — W230XXA Caught, crushed, jammed, or pinched between moving objects, initial encounter: Secondary | ICD-10-CM | POA: Insufficient documentation

## 2018-03-24 DIAGNOSIS — Y9389 Activity, other specified: Secondary | ICD-10-CM | POA: Insufficient documentation

## 2018-03-24 DIAGNOSIS — E039 Hypothyroidism, unspecified: Secondary | ICD-10-CM | POA: Insufficient documentation

## 2018-03-24 DIAGNOSIS — Y998 Other external cause status: Secondary | ICD-10-CM | POA: Diagnosis not present

## 2018-03-24 DIAGNOSIS — S6992XA Unspecified injury of left wrist, hand and finger(s), initial encounter: Secondary | ICD-10-CM | POA: Diagnosis present

## 2018-03-24 DIAGNOSIS — S63281A Dislocation of proximal interphalangeal joint of left index finger, initial encounter: Secondary | ICD-10-CM | POA: Insufficient documentation

## 2018-03-24 DIAGNOSIS — Z87891 Personal history of nicotine dependence: Secondary | ICD-10-CM | POA: Diagnosis not present

## 2018-03-24 DIAGNOSIS — Z79899 Other long term (current) drug therapy: Secondary | ICD-10-CM | POA: Insufficient documentation

## 2018-03-24 DIAGNOSIS — Y929 Unspecified place or not applicable: Secondary | ICD-10-CM | POA: Insufficient documentation

## 2018-03-24 DIAGNOSIS — S63289A Dislocation of proximal interphalangeal joint of unspecified finger, initial encounter: Secondary | ICD-10-CM

## 2018-03-24 MED ORDER — OXYCODONE-ACETAMINOPHEN 5-325 MG PO TABS: 1.0000 | ORAL_TABLET | ORAL | 0 refills | Status: DC | PRN

## 2018-03-24 MED ORDER — OXYCODONE-ACETAMINOPHEN 5-325 MG PO TABS
1.0000 | ORAL_TABLET | Freq: Once | ORAL | Status: AC
  Administered 2018-03-24: 1 via ORAL
  Filled 2018-03-24: qty 1

## 2018-03-24 NOTE — Discharge Instructions (Signed)
Your finger was injured today with a dislocation.  We successfully reduced it.  There was no evidence of fracture on the images.  Please keep the finger in the splint for the next several days and follow-up with the hand doctors.  If any symptoms change or worsen, please return to the nearest emergency department.

## 2018-03-24 NOTE — ED Triage Notes (Signed)
Pt injured left index finger when a dolly in his truck slid forward and jammed his finger, approximately 45 minutes prior to arrival

## 2018-03-24 NOTE — ED Provider Notes (Signed)
[facility] EMERGENCY DEPARTMENT Provider Note   CSN: [id number] Arrival date & time: 03/24/18  1914     History   Chief Complaint Chief Complaint  Patient presents with  . Finger Injury    HPI Eveline KetoStanley A Crisman is a 52 y.o. male.  The history is provided by the patient and the spouse.  Hand Pain  This is a new problem. The current episode started less than 1 hour ago. The problem occurs constantly. The problem has not changed since onset.Pertinent negatives include no chest pain, no abdominal pain, no headaches and no shortness of breath. Nothing aggravates the symptoms. Nothing relieves the symptoms. He has tried nothing for the symptoms. The treatment provided no relief.    Past Medical History:  Diagnosis Date  . Hypothyroid   . Pneumonia     There are no active problems to display for this patient.   Past Surgical History:  Procedure Laterality Date  . HAND TENDON SURGERY Left   . QUADRICEPS TENDON REPAIR Left 06/13/2015   Procedure: REPAIR LEFT QUADRICEP TENDON;  Surgeon: Nadara MustardMarcus Duda V, MD;  Location: [facility] OR;  Service: Orthopedics;  Laterality: Left;        Home Medications    Prior to Admission medications   Medication Sig Start Date End Date Taking? Authorizing Provider  levothyroxine (SYNTHROID, LEVOTHROID) 200 MCG tablet Take 200 mcg by mouth daily.      [provider]  Multiple Vitamin (MULTIVITAMIN) tablet Take 1 tablet by mouth daily.      [provider]  oxyCODONE-acetaminophen (PERCOCET/ROXICET) 5-325 MG tablet Take 1 tablet by mouth every 4 (four) hours as needed for severe pain. 03/24/18   Jordis Repetto, Canary Brimhristopher J, MD    Family History Family History  Problem Relation Age of Onset  . Heart attack Father     Social History Social History   Tobacco Use  . Smoking status: Former Smoker    Last attempt to quit: 06/12/1995    Years since quitting: 22.7  . Smokeless tobacco: Never Used  Substance Use Topics  .  Alcohol use: Yes    Comment: 6 drinks/week  . Drug use: No     Allergies   Patient has no known allergies.   Review of Systems Review of Systems  Constitutional: Negative for activity change, chills, diaphoresis, fatigue and fever.  HENT: Negative for congestion and rhinorrhea.   Eyes: Negative for visual disturbance.  Respiratory: Negative for cough, chest tightness, shortness of breath, wheezing and stridor.   Cardiovascular: Negative for chest pain, palpitations and leg swelling.  Gastrointestinal: Negative for abdominal distention, abdominal pain, blood in stool, constipation, diarrhea, nausea and vomiting.  Genitourinary: Negative for difficulty urinating, dysuria and flank pain.  Musculoskeletal: Negative for back pain and gait problem.  Skin: Negative for rash and wound.  Neurological: Negative for dizziness, weakness, light-headedness and headaches.  Psychiatric/Behavioral: Negative for agitation.  All other systems reviewed and are negative.    Physical Exam Updated Vital Signs BP (!) 147/104 (BP Location: Right Arm)   Pulse 70   Temp 98.8 F (37.1 C) (Oral)   Resp 18   Ht 5\' 8"  (1.727 m)   Wt 90.7 kg (200 lb)   SpO2 96%   BMI 30.41 kg/m   Physical Exam  Constitutional: He is oriented to person, place, and time. He appears well-developed and well-nourished. No distress.  HENT:  Head: Normocephalic and atraumatic.  Mouth/Throat: Oropharynx is clear and moist. No oropharyngeal exudate.  Eyes: Conjunctivae  are normal.  Neck: Neck supple.  Cardiovascular: Normal rate and regular rhythm.  No murmur heard. Pulmonary/Chest: Effort normal and breath sounds normal. No respiratory distress.  Abdominal: Soft. There is no tenderness.  Musculoskeletal: He exhibits tenderness and deformity. He exhibits no edema.       Left hand: He exhibits decreased range of motion, tenderness, deformity and swelling. He exhibits normal capillary refill and no laceration. Normal  sensation noted. Normal strength noted.       Hands: Neurological: He is alert and oriented to person, place, and time. No sensory deficit. He exhibits normal muscle tone.  Skin: Skin is warm and dry. Capillary refill takes less than 2 seconds. He is not diaphoretic. No erythema. No pallor.  Psychiatric: He has a normal mood and affect.  Nursing note and vitals reviewed.    ED Treatments / Results  Labs (all labs ordered are listed, but only abnormal results are displayed) Labs Reviewed - No data to display  EKG None  Radiology Dg Finger Index Left  Result Date: 03/24/2018 CLINICAL DATA:  Postreduction left index finger. EXAM: LEFT INDEX FINGER 2+V COMPARISON:  03/24/2018 FINDINGS: Anatomic alignment of the left second finger postreduction. Soft tissue swelling. No acute fractures identified. No destructive or expansile bone lesions. Surgical clips noted at the base of the thumb. IMPRESSION: Successful reduction of previous dislocation of left proximal interphalangeal joint. Soft tissue swelling. No fractures identified. Electronically Signed   By: Burman Nieves M.D.   On: 03/24/2018 21:17   Dg Finger Index Left  Result Date: 03/24/2018 CLINICAL DATA:  Patient injured left index finger with a truck dolly 45 minutes prior to arrival. EXAM: LEFT INDEX FINGER 2+V COMPARISON:  None. FINDINGS: Left PIP joint dislocation of the index finger with dorsal and ulnar displacement of the middle phalanx relative to the head of the proximal phalanx. No acute fracture or suspicious osseous lesions. Surgical clips project at the base of the thumb. IMPRESSION: Acute left PIP joint dislocation of the left index finger with dorsal and ulnar displacement of the middle phalanx relative to the head of the proximal phalanx. No fracture identified. Electronically Signed   By: Tollie Eth M.D.   On: 03/24/2018 20:00    Procedures Reduction of dislocation Date/Time: 03/25/2018 1:19 AM Performed by: Heide Scales, MD Authorized by: Heide Scales, MD  Consent: Verbal consent obtained. Risks and benefits: risks, benefits and alternatives were discussed Consent given by: patient Patient understanding: patient states understanding of the procedure being performed Patient identity confirmed: verbally with patient Time out: Immediately prior to procedure a "time out" was called to verify the correct patient, procedure, equipment, support staff and site/side marked as required. Local anesthesia used: no  Anesthesia: Local anesthesia used: no  Sedation: Patient sedated: no  Patient tolerance: Patient tolerated the procedure well with no immediate complications Comments: Reduction of left index finger proximal interphalangeal joint dislocation.    (including critical care time)  Medications Ordered in ED Medications  oxyCODONE-acetaminophen (PERCOCET/ROXICET) 5-325 MG per tablet 1 tablet (1 tablet Oral Given 03/24/18 2051)     Initial Impression / Assessment and Plan / ED Course  I have reviewed the triage vital signs and the nursing notes.  Pertinent labs & imaging results that were available during my care of the patient were reviewed by me and considered in my medical decision making (see chart for details).     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  Patient reports she was going to a gait for his band when the equipment began to slip towards him.  Patient tried to grab it but jammed his left index finger.  He reports immediate onset of pain and inability to bend it.  He reports the pain is moderate to severe.  He denies any lacerations or skin injury.  He presents for evaluation.  He denies other locations of pain and did not hit his elbow, wrist, or head.  On exam, patient has tenderness and deformity to the left index finger.  Normal sensation and capillary refill distally.  Patient is unable to angulate  his finger at the proximal phalangeal joint.  No other tenderness present aside from his location.  No snuffbox tenderness.  Patient otherwise appears well.  X-rays obtained showing dislocation at the PIP joint.  No fracture seen.  A bedside dislocation reduction was performed without any sedation or analgesia.  Patient tolerated well but began having some pain.  Pain pill was provided.  Postreduction films were normal with no fracture.  Patient placed in a finger splint and will follow-up with hand given his profession of guitar playing and need for expedient return to normal.  After reduction, patient was able to move the finger better and had normal sensation.  Patient given prescription for pain medication and will follow-up with hand.  Patient understood follow-up instructions and was discharged in good condition with reduction of his dislocation.   Final Clinical Impressions(s) / ED Diagnoses   Final diagnoses:  Dislocation of finger PIP joint, initial encounter  Injury of index finger, left, initial encounter    ED Discharge Orders        Ordered    oxyCODONE-acetaminophen (PERCOCET/ROXICET) 5-325 MG tablet  Every 4 hours PRN     03/24/18 2121     Clinical Impression: 1. Dislocation of finger PIP joint, initial encounter   2. Injury of index finger, left, initial encounter     Disposition: Discharge  Condition: Good  I have discussed the results, Dx and Tx plan with the pt(& family if present). He/she/they expressed understanding and agree(s) with the plan. Discharge instructions discussed at great length. Strict return precautions discussed and pt &/or family have verbalized understanding of the instructions. No further questions at time of discharge.    New Prescriptions   OXYCODONE-ACETAMINOPHEN (PERCOCET/ROXICET) 5-325 MG TABLET    Take 1 tablet by mouth every 4 (four) hours as needed for severe pain.    Follow Up: Bradly Bienenstock, MD [address] [place]  [address] [place] [address] [phone]  Schedule an appointment as soon as possible for a visit       Stillman Buenger, Canary Brim, MD 03/25/18 [phone]

## 2018-10-06 DIAGNOSIS — U071 COVID-19: Secondary | ICD-10-CM

## 2018-10-06 HISTORY — DX: COVID-19: U07.1

## 2019-06-28 ENCOUNTER — Encounter: Payer: Self-pay | Admitting: Family

## 2019-06-28 ENCOUNTER — Other Ambulatory Visit: Payer: Self-pay

## 2019-06-28 ENCOUNTER — Ambulatory Visit: Payer: Self-pay

## 2019-06-28 ENCOUNTER — Ambulatory Visit (INDEPENDENT_AMBULATORY_CARE_PROVIDER_SITE_OTHER): Payer: BC Managed Care – PPO

## 2019-06-28 ENCOUNTER — Ambulatory Visit (INDEPENDENT_AMBULATORY_CARE_PROVIDER_SITE_OTHER): Payer: BC Managed Care – PPO | Admitting: Family

## 2019-06-28 VITALS — Ht 68.0 in | Wt 200.0 lb

## 2019-06-28 DIAGNOSIS — M25561 Pain in right knee: Secondary | ICD-10-CM | POA: Diagnosis not present

## 2019-06-28 DIAGNOSIS — M25562 Pain in left knee: Secondary | ICD-10-CM

## 2019-06-28 DIAGNOSIS — M1711 Unilateral primary osteoarthritis, right knee: Secondary | ICD-10-CM | POA: Diagnosis not present

## 2019-06-28 NOTE — Progress Notes (Signed)
Office Visit Note   Patient: Tyler [name]           Date of Birth: Mar 12, 1966           MRN: [id number] Visit Date: 06/28/2019              Requested by: Delilah Shan, MD [facility] [address],  [place] [address] PCP: Delilah Shan, MD  Chief Complaint  Patient presents with  . Right Knee - Pain  . Left Knee - Pain      HPI: Patient is a 53 year old gentleman with osteoarthritis both knees he is status post a quad tendon repair on the left and 2016 and is most recently status post right knee arthroscopy.  Patient states he had no relief from his arthroscopic procedure of the right knee this was performed at orthopedic sports medicine in [facility].  Patient states he cannot perform his activities of daily living and would like to consider right total knee arthroplasty.  He states that sitting prolonged periods of time is unbearably painful for start up.  Assessment & Plan: Visit Diagnoses:  1. Pain in both knees, unspecified chronicity   2. Unilateral primary osteoarthritis, right knee     Plan: Patient was to proceed with a right total knee arthroplasty risks and benefits were discussed including infection neurovascular injury persistent pain need for additional surgery.  Patient states he understands wished to proceed at this time.  Follow-Up Instructions: Return in about 2 weeks (around 07/12/2019) for Follow-up postoperatively.Manson Passey Exam  Patient is alert, oriented, no adenopathy, well-dressed, normal affect, normal respiratory effort. Examination patient has varus alignment of both knees he has difficulty getting from sitting to a standing position.  He has crepitation range of motion of the right knee pain to palpation the patellofemoral joint as well as medial lateral joint line.  Collaterals and cruciates are stable.  He lacks about 5 degrees to full extension flexion to 100 degrees.  Imaging: Xr Knee 1-2 Views Left  Result Date: 06/28/2019 2  view radiographs of the left knee shows medial joint space narrowing varus alignment osteophytic bone spurs in all 3 compartments with a bipartite patella.  Xr Knee 1-2 Views Right  Result Date: 06/28/2019 2 view radiographs of the right knee shows joint space narrowing of the medial joint line with varus alignment subchondral sclerosis with periarticular bony spurs in all 3 compartments.  With a bipartite patella.  No images are attached to the encounter.  Labs: No results found for: HGBA1C, ESRSEDRATE, CRP, LABURIC, REPTSTATUS, GRAMSTAIN, CULT, LABORGA   Lab Results  Component Value Date   ALBUMIN 3.6 06/13/2015    No results found for: MG No results found for: VD25OH  No results found for: PREALBUMIN CBC EXTENDED Latest Ref Rng & Units 06/13/2015  WBC 4.0 - 10.5 K/uL 5.8  RBC 4.22 - 5.81 MIL/uL 4.54  HGB 13.0 - 17.0 g/dL 13.9  HCT 39.0 - 52.0 % 41.5  PLT 150 - 400 K/uL 194     Body mass index is 30.41 kg/m.  Orders:  Orders Placed This Encounter  Procedures  . XR Knee 1-2 Views Left  . XR Knee 1-2 Views Right   No orders of the defined types were placed in this encounter.    Procedures: No procedures performed  Clinical Data: No additional findings.  ROS:  All other systems negative, except as noted in the HPI. Review of Systems  Objective: Vital Signs:  Ht 5\' 8"  (1.727 m)   Wt 200 lb (90.7 kg)   BMI 30.41 kg/m   Specialty Comments:  No specialty comments available.  PMFS History: There are no active problems to display for this patient.  Past Medical History:  Diagnosis Date  . Hypothyroid   . Pneumonia     Family History  Problem Relation Age of Onset  . Heart attack Father     Past Surgical History:  Procedure Laterality Date  . HAND TENDON SURGERY Left   . QUADRICEPS TENDON REPAIR Left 06/13/2015   Procedure: REPAIR LEFT QUADRICEP TENDON;  Surgeon: 08/13/2015, MD;  Location: [facility] OR;  Service: Orthopedics;  Laterality: Left;   Social  History   Occupational History  . Not on file  Tobacco Use  . Smoking status: Former Smoker    Quit date: 06/12/1995    Years since quitting: 24.0  . Smokeless tobacco: Never Used  Substance and Sexual Activity  . Alcohol use: Yes    Comment: 6 drinks/week  . Drug use: No  . Sexual activity: Not on file

## 2019-08-11 ENCOUNTER — Other Ambulatory Visit: Payer: Self-pay | Admitting: Physician Assistant

## 2019-08-12 NOTE — Progress Notes (Signed)
[facility] [address] [place], [place] - [address] [place] [address] Phone: [phone] Fax: [phone]      Your procedure is scheduled on November 11th, 2020.   Report to [facility] Main Entrance "A" at 6:30 A.M., and check in at the Admitting office.   Call this number if you have problems the morning of surgery:  [phone]  Call [phone] if you have any questions prior to your surgery date Monday-Friday 8am-4pm    Remember:  Do not eat or drink after midnight the night before your surgery  You may drink clear liquids until 5:30AM the morning of your surgery.   Clear liquids allowed are: Water, Non-Citrus Juices (without pulp), Carbonated Beverages, Clear Tea, Black Coffee Only, and Gatorade    Take these medicines the morning of surgery with A SIP OF WATER :  Levothyroxine (Synthroid)  7 days prior to surgery STOP taking any Aspirin (unless otherwise instructed by your surgeon), Aleve, Naproxen, Ibuprofen, Motrin, Advil, Goody's, BC's, all herbal medications, fish oil, and all vitamins.    The Morning of Surgery  Do not wear jewelry, make-up or nail polish.  Do not wear lotions, powders, or perfumes/colognes, or deodorant  Do not shave 48 hours prior to surgery.  Men may shave face and neck.  Do not bring valuables to the hospital.  [facility] is not responsible for any belongings or valuables.  If you are a smoker, DO NOT Smoke 24 hours prior to surgery IF you wear a CPAP at night please bring your mask, tubing, and machine the morning of surgery   Remember that you must have someone to transport you home after your surgery, and remain with you for 24 hours if you are discharged the same day.   Contacts, glasses, hearing aids, dentures or bridgework may not be worn into surgery.    Leave your suitcase in the car.  After surgery it may be brought to your room.  For patients admitted to the hospital, discharge  time will be determined by your treatment team.  Patients discharged the day of surgery will not be allowed to drive home.    Special instructions:   [facility]- Preparing For Surgery  Before surgery, you can play an important role. Because skin is not sterile, your skin needs to be as free of germs as possible. You can reduce the number of germs on your skin by washing with CHG (chlorahexidine gluconate) Soap before surgery.  CHG is an antiseptic cleaner which kills germs and bonds with the skin to continue killing germs even after washing.    Oral Hygiene is also important to reduce your risk of infection.  Remember - BRUSH YOUR TEETH THE MORNING OF SURGERY WITH YOUR REGULAR TOOTHPASTE  Please do not use if you have an allergy to CHG or antibacterial soaps. If your skin becomes reddened/irritated stop using the CHG.  Do not shave (including legs and underarms) for at least 48 hours prior to first CHG shower. It is OK to shave your face.  Please follow these instructions carefully.   1. Shower the NIGHT BEFORE SURGERY and the MORNING OF SURGERY with CHG Soap.   2. If you chose to wash your hair, wash your hair first as usual with your normal shampoo.  3. After you shampoo, rinse your hair and body thoroughly to remove the shampoo.  4. Use CHG as you would any other liquid soap. You can apply CHG directly  to the skin and wash gently with a scrungie or a clean washcloth.   5. Apply the CHG Soap to your body ONLY FROM THE NECK DOWN.  Do not use on open wounds or open sores. Avoid contact with your eyes, ears, mouth and genitals (private parts). Wash Face and genitals (private parts)  with your normal soap.   6. Wash thoroughly, paying special attention to the area where your surgery will be performed.  7. Thoroughly rinse your body with warm water from the neck down.  8. DO NOT shower/wash with your normal soap after using and rinsing off the CHG Soap.  9. Pat yourself dry with a  CLEAN TOWEL.  10. Wear CLEAN PAJAMAS to bed the night before surgery, wear comfortable clothes the morning of surgery  11. Place CLEAN SHEETS on your bed the night of your first shower and DO NOT SLEEP WITH PETS.    Day of Surgery:  Do not apply any deodorants/lotions. Please shower the morning of surgery with the CHG soap  Please wear clean clothes to the hospital/surgery center.   Remember to brush your teeth WITH YOUR REGULAR TOOTHPASTE.   Please read over the following fact sheets that you were given.

## 2019-08-15 ENCOUNTER — Other Ambulatory Visit (HOSPITAL_COMMUNITY)
Admission: RE | Admit: 2019-08-15 | Discharge: 2019-08-15 | Disposition: A | Discharge status: Home / Self Care | Payer: BC Managed Care – PPO | Source: Ambulatory Visit | Attending: Orthopedic Surgery | Admitting: Orthopedic Surgery

## 2019-08-15 ENCOUNTER — Other Ambulatory Visit: Payer: Self-pay

## 2019-08-15 ENCOUNTER — Encounter (HOSPITAL_COMMUNITY)
Admission: RE | Admit: 2019-08-15 | Discharge: 2019-08-15 | Disposition: A | Discharge status: Home / Self Care | Payer: BC Managed Care – PPO | Source: Ambulatory Visit | Attending: Orthopedic Surgery | Admitting: Orthopedic Surgery

## 2019-08-15 ENCOUNTER — Encounter (HOSPITAL_COMMUNITY): Payer: Self-pay

## 2019-08-15 DIAGNOSIS — Z01812 Encounter for preprocedural laboratory examination: Secondary | ICD-10-CM | POA: Diagnosis not present

## 2019-08-15 LAB — CBC
HCT: 42 % (ref 39.0–52.0)
Hemoglobin: 14 g/dL (ref 13.0–17.0)
MCH: 30.4 pg (ref 26.0–34.0)
MCHC: 33.3 g/dL (ref 30.0–36.0)
MCV: 91.3 fL (ref 80.0–100.0)
Platelets: 196 10*3/uL (ref 150–400)
RBC: 4.6 MIL/uL (ref 4.22–5.81)
RDW: 12.8 % (ref 11.5–15.5)
WBC: 4.6 10*3/uL (ref 4.0–10.5)
nRBC: 0 % (ref 0.0–0.2)

## 2019-08-15 LAB — SURGICAL PCR SCREEN
MRSA, PCR: NEGATIVE
Staphylococcus aureus: NEGATIVE

## 2019-08-15 LAB — SARS CORONAVIRUS 2 (TAT 6-24 HRS): SARS Coronavirus 2: NEGATIVE

## 2019-08-15 NOTE — Progress Notes (Signed)
PCP - Scarlette Ar, MD Cardiologist - denies  Chest x-ray - N/A EKG - N/A Stress Test - denies ECHO - denies Cardiac Cath - denies  Blood Thinner Instructions: N/A Aspirin Instructions: N/A  ERAS Protcol - Able to drink clear liquids until 5:30AM DOS PRE-SURGERY Ensure or G2- None  COVID TEST- scheduled after PAT visit today 08/15/19  Coronavirus Screening  Have you experienced the following symptoms:  Cough yes/no: No Fever (>100.49F)  yes/no: No Runny nose yes/no: No Sore throat yes/no: No Difficulty breathing/shortness of breath  yes/no: No  Have you or a family member traveled in the last 14 days and where? yes/no: No  If the patient indicates "YES" to the above questions, their PAT will be rescheduled to limit the exposure to others and, the surgeon will be notified. THE PATIENT WILL NEED TO BE ASYMPTOMATIC FOR 14 DAYS.   If the patient is not experiencing any of these symptoms, the PAT nurse will instruct them to NOT bring anyone with them to their appointment since they may have these symptoms or traveled as well.   Please remind your patients and families that hospital visitation restrictions are in effect and the importance of the restrictions.   Anesthesia review: No  Patient denies shortness of breath, fever, cough and chest pain at PAT appointment   All instructions explained to the patient, with a verbal understanding of the material. Patient agrees to go over the instructions while at home for a better understanding. Patient also instructed to self quarantine after being tested for COVID-19. The opportunity to ask questions was provided.

## 2019-08-16 ENCOUNTER — Encounter (HOSPITAL_COMMUNITY): Payer: Self-pay | Admitting: Certified Registered"

## 2019-08-16 NOTE — Anesthesia Preprocedure Evaluation (Addendum)
Anesthesia Evaluation  Patient identified by MRN, date of birth, ID band Patient awake    Reviewed: Allergy & Precautions, NPO status , Patient's Chart, lab work & pertinent test results  History of Anesthesia Complications Negative for: history of anesthetic complications  Airway Mallampati: III  TM Distance: >3 FB Neck ROM: Full    Dental no notable dental hx.    Pulmonary former smoker,    Pulmonary exam normal        Cardiovascular negative cardio ROS Normal cardiovascular exam     Neuro/Psych negative neurological ROS     GI/Hepatic negative GI ROS, Neg liver ROS,   Endo/Other  Hypothyroidism   Renal/GU negative Renal ROS     Musculoskeletal negative musculoskeletal ROS (+)   Abdominal (+) - obese,   Peds  Hematology negative hematology ROS (+)   Anesthesia Other Findings Osteoarthritis Right Knee  Reproductive/Obstetrics                           Anesthesia Physical Anesthesia Plan  ASA: II  Anesthesia Plan: Spinal   Post-op Pain Management:  Regional for Post-op pain   Induction:   PONV Risk Score and Plan: 2 and Treatment may vary due to age or medical condition, Ondansetron, Midazolam, Dexamethasone and Propofol infusion  Airway Management Planned: Natural Airway and Simple Face Mask  Additional Equipment: None  Intra-op Plan:   Post-operative Plan:   Informed Consent: I have reviewed the patients History and Physical, chart, labs and discussed the procedure including the risks, benefits and alternatives for the proposed anesthesia with the patient or authorized representative who has indicated his/her understanding and acceptance.     Dental advisory given  Plan Discussed with: CRNA  Anesthesia Plan Comments:        Anesthesia Quick Evaluation

## 2019-08-17 ENCOUNTER — Encounter (HOSPITAL_COMMUNITY)
Admission: AD | Disposition: A | Discharge status: Home / Self Care | Payer: Self-pay | Source: Ambulatory Visit | Attending: Orthopedic Surgery

## 2019-08-17 ENCOUNTER — Other Ambulatory Visit: Payer: Self-pay | Admitting: Physician Assistant

## 2019-08-17 ENCOUNTER — Other Ambulatory Visit: Payer: Self-pay

## 2019-08-17 ENCOUNTER — Ambulatory Visit (HOSPITAL_COMMUNITY): Payer: BC Managed Care – PPO | Admitting: Certified Registered"

## 2019-08-17 ENCOUNTER — Inpatient Hospital Stay (HOSPITAL_COMMUNITY)
Admission: AD | Admit: 2019-08-17 | Discharge: 2019-08-18 | DRG: 470 | Disposition: A | Discharge status: Home / Self Care | Payer: BC Managed Care – PPO | Source: Ambulatory Visit | Attending: Orthopedic Surgery | Admitting: Orthopedic Surgery

## 2019-08-17 ENCOUNTER — Encounter (HOSPITAL_COMMUNITY): Payer: Self-pay

## 2019-08-17 DIAGNOSIS — E039 Hypothyroidism, unspecified: Secondary | ICD-10-CM | POA: Diagnosis present

## 2019-08-17 DIAGNOSIS — Z8249 Family history of ischemic heart disease and other diseases of the circulatory system: Secondary | ICD-10-CM

## 2019-08-17 DIAGNOSIS — Z87891 Personal history of nicotine dependence: Secondary | ICD-10-CM | POA: Diagnosis not present

## 2019-08-17 DIAGNOSIS — M1711 Unilateral primary osteoarthritis, right knee: Principal | ICD-10-CM | POA: Diagnosis present

## 2019-08-17 HISTORY — DX: Unspecified osteoarthritis, unspecified site: M19.90

## 2019-08-17 HISTORY — PX: TOTAL KNEE ARTHROPLASTY: SHX125

## 2019-08-17 SURGERY — ARTHROPLASTY, KNEE, TOTAL
Anesthesia: Spinal | Site: Knee | Laterality: Right

## 2019-08-17 MED ORDER — POVIDONE-IODINE 10 % EX SWAB: 2.0000 "application " | Freq: Once | CUTANEOUS | Status: DC

## 2019-08-17 MED ORDER — ONDANSETRON HCL 4 MG/2ML IJ SOLN
INTRAMUSCULAR | Status: AC
  Filled 2019-08-17: qty 2

## 2019-08-17 MED ORDER — DOCUSATE SODIUM 100 MG PO CAPS
100.0000 mg | ORAL_CAPSULE | Freq: Two times a day (BID) | ORAL | Status: DC
  Administered 2019-08-17 – 2019-08-18 (×2): 100 mg via ORAL
  Filled 2019-08-17 (×3): qty 1

## 2019-08-17 MED ORDER — TRANEXAMIC ACID-NACL 1000-0.7 MG/100ML-% IV SOLN
INTRAVENOUS | Status: DC | PRN
  Administered 2019-08-17: 1000 mg via INTRAVENOUS

## 2019-08-17 MED ORDER — LEVOTHYROXINE SODIUM 100 MCG PO TABS
200.0000 ug | ORAL_TABLET | Freq: Every day | ORAL | Status: DC
  Administered 2019-08-18: 05:00:00 200 ug via ORAL
  Filled 2019-08-17 (×2): qty 2

## 2019-08-17 MED ORDER — OXYCODONE HCL 5 MG PO TABS
5.0000 mg | ORAL_TABLET | ORAL | Status: DC | PRN
  Administered 2019-08-17 – 2019-08-18 (×4): 10 mg via ORAL
  Filled 2019-08-17 (×2): qty 2

## 2019-08-17 MED ORDER — CEFAZOLIN SODIUM-DEXTROSE 2-4 GM/100ML-% IV SOLN
INTRAVENOUS | Status: AC
  Filled 2019-08-17: qty 100

## 2019-08-17 MED ORDER — METHOCARBAMOL 1000 MG/10ML IJ SOLN
500.0000 mg | Freq: Four times a day (QID) | INTRAVENOUS | Status: DC | PRN
  Filled 2019-08-17: qty 5

## 2019-08-17 MED ORDER — METHOCARBAMOL 500 MG PO TABS
500.0000 mg | ORAL_TABLET | Freq: Four times a day (QID) | ORAL | Status: DC | PRN
  Administered 2019-08-17 (×2): 500 mg via ORAL
  Filled 2019-08-17 (×2): qty 1

## 2019-08-17 MED ORDER — 0.9 % SODIUM CHLORIDE (POUR BTL) OPTIME
TOPICAL | Status: DC | PRN
  Administered 2019-08-17: 1000 mL

## 2019-08-17 MED ORDER — OXYCODONE HCL 5 MG PO TABS
ORAL_TABLET | ORAL | Status: AC
  Filled 2019-08-17: qty 2

## 2019-08-17 MED ORDER — ACETAMINOPHEN 500 MG PO TABS
1000.0000 mg | ORAL_TABLET | Freq: Once | ORAL | Status: AC
  Administered 2019-08-17: 16:00:00 1000 mg via ORAL
  Filled 2019-08-17: qty 2

## 2019-08-17 MED ORDER — ACETAMINOPHEN 325 MG PO TABS: 325.0000 mg | ORAL_TABLET | Freq: Four times a day (QID) | ORAL | Status: DC | PRN

## 2019-08-17 MED ORDER — LIDOCAINE 2% (20 MG/ML) 5 ML SYRINGE
INTRAMUSCULAR | Status: AC
  Filled 2019-08-17: qty 5

## 2019-08-17 MED ORDER — CEFAZOLIN SODIUM-DEXTROSE 2-4 GM/100ML-% IV SOLN
2.0000 g | INTRAVENOUS | Status: AC
  Administered 2019-08-17: 2 g via INTRAVENOUS

## 2019-08-17 MED ORDER — MIDAZOLAM HCL 5 MG/5ML IJ SOLN
INTRAMUSCULAR | Status: DC | PRN
  Administered 2019-08-17: 2 mg via INTRAVENOUS

## 2019-08-17 MED ORDER — PROPOFOL 500 MG/50ML IV EMUL
INTRAVENOUS | Status: DC | PRN
  Administered 2019-08-17: 100 ug/kg/min via INTRAVENOUS

## 2019-08-17 MED ORDER — EPHEDRINE SULFATE-NACL 50-0.9 MG/10ML-% IV SOSY
PREFILLED_SYRINGE | INTRAVENOUS | Status: DC | PRN
  Administered 2019-08-17 (×3): 10 mg via INTRAVENOUS

## 2019-08-17 MED ORDER — TRANEXAMIC ACID 1000 MG/10ML IV SOLN
INTRAVENOUS | Status: DC | PRN
  Administered 2019-08-17: 2000 mg via TOPICAL

## 2019-08-17 MED ORDER — ONDANSETRON HCL 4 MG PO TABS: 4.0000 mg | ORAL_TABLET | Freq: Four times a day (QID) | ORAL | Status: DC | PRN

## 2019-08-17 MED ORDER — FENTANYL CITRATE (PF) 250 MCG/5ML IJ SOLN
INTRAMUSCULAR | Status: DC | PRN
  Administered 2019-08-17 (×3): 50 ug via INTRAVENOUS

## 2019-08-17 MED ORDER — HYDROMORPHONE HCL 1 MG/ML IJ SOLN
0.5000 mg | INTRAMUSCULAR | Status: DC | PRN
  Administered 2019-08-17 – 2019-08-18 (×2): 1 mg via INTRAVENOUS
  Filled 2019-08-17 (×2): qty 1

## 2019-08-17 MED ORDER — METOCLOPRAMIDE HCL 5 MG/ML IJ SOLN: 5.0000 mg | Freq: Three times a day (TID) | INTRAMUSCULAR | Status: DC | PRN

## 2019-08-17 MED ORDER — ASPIRIN EC 325 MG PO TBEC
325.0000 mg | DELAYED_RELEASE_TABLET | Freq: Every day | ORAL | Status: DC
  Administered 2019-08-18: 10:00:00 325 mg via ORAL
  Filled 2019-08-17: qty 1

## 2019-08-17 MED ORDER — METHOCARBAMOL 500 MG PO TABS
ORAL_TABLET | ORAL | Status: AC
  Filled 2019-08-17: qty 1

## 2019-08-17 MED ORDER — LACTATED RINGERS IV SOLN
INTRAVENOUS | Status: DC | PRN
  Administered 2019-08-17: 08:00:00 via INTRAVENOUS

## 2019-08-17 MED ORDER — OXYCODONE HCL 5 MG PO TABS
10.0000 mg | ORAL_TABLET | ORAL | Status: DC | PRN
  Filled 2019-08-17: qty 3

## 2019-08-17 MED ORDER — BUPIVACAINE-EPINEPHRINE (PF) 0.5% -1:200000 IJ SOLN
INTRAMUSCULAR | Status: DC | PRN
  Administered 2019-08-17: 15 mL via PERINEURAL

## 2019-08-17 MED ORDER — CEFAZOLIN SODIUM-DEXTROSE 1-4 GM/50ML-% IV SOLN
1.0000 g | Freq: Four times a day (QID) | INTRAVENOUS | Status: AC
  Administered 2019-08-17 (×2): 1 g via INTRAVENOUS
  Filled 2019-08-17 (×2): qty 50

## 2019-08-17 MED ORDER — ACETAMINOPHEN 500 MG PO TABS
ORAL_TABLET | ORAL | Status: AC
  Administered 2019-08-17: 07:00:00 1000 mg via ORAL
  Filled 2019-08-17: qty 2

## 2019-08-17 MED ORDER — TRANEXAMIC ACID 1000 MG/10ML IV SOLN
2000.0000 mg | Freq: Once | INTRAVENOUS | Status: DC
  Filled 2019-08-17: qty 20

## 2019-08-17 MED ORDER — FENTANYL CITRATE (PF) 250 MCG/5ML IJ SOLN
INTRAMUSCULAR | Status: AC
  Filled 2019-08-17: qty 5

## 2019-08-17 MED ORDER — PROPOFOL 10 MG/ML IV BOLUS
INTRAVENOUS | Status: DC | PRN
  Administered 2019-08-17: 10 ug via INTRAVENOUS

## 2019-08-17 MED ORDER — TRANEXAMIC ACID-NACL 1000-0.7 MG/100ML-% IV SOLN
INTRAVENOUS | Status: AC
  Filled 2019-08-17: qty 100

## 2019-08-17 MED ORDER — CHLORHEXIDINE GLUCONATE 4 % EX LIQD: 60.0000 mL | Freq: Once | CUTANEOUS | Status: DC

## 2019-08-17 MED ORDER — SODIUM CHLORIDE 0.9 % IV SOLN
INTRAVENOUS | Status: DC
  Administered 2019-08-17 – 2019-08-18 (×2): via INTRAVENOUS

## 2019-08-17 MED ORDER — BUPIVACAINE IN DEXTROSE 0.75-8.25 % IT SOLN
INTRATHECAL | Status: DC | PRN
  Administered 2019-08-17: 1.7 mL via INTRATHECAL

## 2019-08-17 MED ORDER — PHENOL 1.4 % MT LIQD: 1.0000 | OROMUCOSAL | Status: DC | PRN

## 2019-08-17 MED ORDER — METOCLOPRAMIDE HCL 5 MG PO TABS: 5.0000 mg | ORAL_TABLET | Freq: Three times a day (TID) | ORAL | Status: DC | PRN

## 2019-08-17 MED ORDER — ONDANSETRON HCL 4 MG/2ML IJ SOLN: 4.0000 mg | Freq: Four times a day (QID) | INTRAMUSCULAR | Status: DC | PRN

## 2019-08-17 MED ORDER — MIDAZOLAM HCL 2 MG/2ML IJ SOLN
INTRAMUSCULAR | Status: AC
  Filled 2019-08-17: qty 2

## 2019-08-17 MED ORDER — POVIDONE-IODINE 10 % EX SWAB
2.0000 "application " | Freq: Once | CUTANEOUS | Status: AC
  Administered 2019-08-17: 2 via TOPICAL

## 2019-08-17 MED ORDER — ACETAMINOPHEN 500 MG PO TABS
1000.0000 mg | ORAL_TABLET | Freq: Once | ORAL | Status: AC
  Administered 2019-08-17: 07:00:00 1000 mg via ORAL

## 2019-08-17 MED ORDER — LIDOCAINE 2% (20 MG/ML) 5 ML SYRINGE
INTRAMUSCULAR | Status: DC | PRN
  Administered 2019-08-17: 40 mg via INTRAVENOUS

## 2019-08-17 MED ORDER — MENTHOL 3 MG MT LOZG: 1.0000 | LOZENGE | OROMUCOSAL | Status: DC | PRN

## 2019-08-17 MED ORDER — SODIUM CHLORIDE 0.9 % IR SOLN
Status: DC | PRN
  Administered 2019-08-17: 3000 mL

## 2019-08-17 MED ORDER — ONDANSETRON HCL 4 MG/2ML IJ SOLN
INTRAMUSCULAR | Status: DC | PRN
  Administered 2019-08-17: 4 mg via INTRAVENOUS

## 2019-08-17 SURGICAL SUPPLY — 57 items
ATTUNE PS FEM RT SZ 6 CEM KNEE (Femur) ×2 IMPLANT
BASE TIBIAL CEM ATTUNE SZ 7 (Knees) ×3 IMPLANT
BASEPLATE TIB CEM ATTUNE SZ7 (Knees) IMPLANT
BLADE SAGITTAL 25.0X1.19X90 (BLADE) ×2 IMPLANT
BLADE SAGITTAL 25.0X1.19X90MM (BLADE) ×1
BLADE SAW SGTL 13X75X1.27 (BLADE) ×3 IMPLANT
BLADE SURG 21 STRL SS (BLADE) ×6 IMPLANT
BNDG COHESIVE 6X5 TAN STRL LF (GAUZE/BANDAGES/DRESSINGS) ×4 IMPLANT
BNDG GAUZE ELAST 4 BULKY (GAUZE/BANDAGES/DRESSINGS) ×3 IMPLANT
BOWL SMART MIX CTS (DISPOSABLE) ×3 IMPLANT
BSPLAT TIB 7 CMNT FX BRNG STRL (Knees) ×1 IMPLANT
CEMENT BONE R 1X40 (Cement) ×6 IMPLANT
COVER SURGICAL LIGHT HANDLE (MISCELLANEOUS) ×3 IMPLANT
COVER WAND RF STERILE (DRAPES) ×3 IMPLANT
CUFF TOURN SGL QUICK 34 (TOURNIQUET CUFF) ×3
CUFF TOURN SGL QUICK 42 (TOURNIQUET CUFF) IMPLANT
CUFF TRNQT CYL 34X4.125X (TOURNIQUET CUFF) ×1 IMPLANT
DRAPE EXTREMITY T 121X128X90 (DISPOSABLE) ×3 IMPLANT
DRAPE HALF SHEET 40X57 (DRAPES) ×6 IMPLANT
DRAPE U-SHAPE 47X51 STRL (DRAPES) ×3 IMPLANT
DRSG ADAPTIC 3X8 NADH LF (GAUZE/BANDAGES/DRESSINGS) ×3 IMPLANT
DRSG PAD ABDOMINAL 8X10 ST (GAUZE/BANDAGES/DRESSINGS) ×3 IMPLANT
DURAPREP 26ML APPLICATOR (WOUND CARE) ×3 IMPLANT
ELECT REM PT RETURN 9FT ADLT (ELECTROSURGICAL) ×3
ELECTRODE REM PT RTRN 9FT ADLT (ELECTROSURGICAL) ×1 IMPLANT
FACESHIELD WRAPAROUND (MASK) ×3 IMPLANT
FACESHIELD WRAPAROUND OR TEAM (MASK) ×1 IMPLANT
GAUZE SPONGE 4X4 12PLY STRL (GAUZE/BANDAGES/DRESSINGS) ×3 IMPLANT
GAUZE SPONGE 4X4 12PLY STRL LF (GAUZE/BANDAGES/DRESSINGS) ×2 IMPLANT
GLOVE BIOGEL PI IND STRL 9 (GLOVE) ×1 IMPLANT
GLOVE BIOGEL PI INDICATOR 9 (GLOVE) ×2
GLOVE SURG ORTHO 9.0 STRL STRW (GLOVE) ×3 IMPLANT
GOWN STRL REUS W/ TWL XL LVL3 (GOWN DISPOSABLE) ×2 IMPLANT
GOWN STRL REUS W/TWL XL LVL3 (GOWN DISPOSABLE) ×6
HANDPIECE INTERPULSE COAX TIP (DISPOSABLE) ×3
INSERT TIB ATTUNE FB SZ6X6 (Insert) ×2 IMPLANT
KIT BASIN OR (CUSTOM PROCEDURE TRAY) ×3 IMPLANT
KIT TURNOVER KIT B (KITS) ×3 IMPLANT
MANIFOLD NEPTUNE II (INSTRUMENTS) ×3 IMPLANT
NS IRRIG 1000ML POUR BTL (IV SOLUTION) ×3 IMPLANT
PACK TOTAL JOINT (CUSTOM PROCEDURE TRAY) ×3 IMPLANT
PAD ABD 8X10 STRL (GAUZE/BANDAGES/DRESSINGS) ×2 IMPLANT
PAD ARMBOARD 7.5X6 YLW CONV (MISCELLANEOUS) ×3 IMPLANT
PATELLA MEDIAL ATTUN 35MM KNEE (Knees) ×2 IMPLANT
PIN DRILL FIX HALF THREAD (BIT) ×2 IMPLANT
PIN STEINMAN FIXATION KNEE (PIN) ×2 IMPLANT
SET HNDPC FAN SPRY TIP SCT (DISPOSABLE) ×1 IMPLANT
STAPLER VISISTAT 35W (STAPLE) ×3 IMPLANT
SUCTION FRAZIER HANDLE 10FR (MISCELLANEOUS)
SUCTION TUBE FRAZIER 10FR DISP (MISCELLANEOUS) IMPLANT
SUT VIC AB 0 CT1 27 (SUTURE) ×6
SUT VIC AB 0 CT1 27XBRD ANBCTR (SUTURE) ×1 IMPLANT
SUT VIC AB 1 CTX 36 (SUTURE) ×6
SUT VIC AB 1 CTX36XBRD ANBCTR (SUTURE) IMPLANT
TOWEL GREEN STERILE (TOWEL DISPOSABLE) ×3 IMPLANT
TOWEL GREEN STERILE FF (TOWEL DISPOSABLE) ×3 IMPLANT
WRAP KNEE MAXI GEL POST OP (GAUZE/BANDAGES/DRESSINGS) ×3 IMPLANT

## 2019-08-17 NOTE — Evaluation (Addendum)
Physical Therapy Evaluation Patient Details Name: Tyler [name] MRN: [id number] DOB: Jul 24, 1966 Today's Date: 08/17/2019   History of Present Illness  Pt is a 53 y.o. male s/p elective R TKA on 08/17/19. PMH includes L quadriceps tendon repair (2016).  Clinical Impression  Pt presents with an overall decrease in functional mobility secondary to above. PTA, pt independent, works and lives with wife. Educ on precautions, positioning, and importance of mobility. Today, pt able to initiate transfer and gait training with RW, requiring up to minA; limited by pain. Expect pt to progress well with mobility. Will follow acutely to address established goals.     Follow Up Recommendations Outpatient PT;Follow surgeon's recommendation for DC plan and follow-up therapies    Equipment Recommendations  Rolling walker with 5" wheels;3in1 (PT)(TBD - pt may be able to borrow)    Recommendations for Other Services       Precautions / Restrictions Precautions Precautions: Fall;Knee Precaution Booklet Issued: No Precaution Comments: Verbally reviewed precautions Restrictions Weight Bearing Restrictions: Yes RLE Weight Bearing: Weight bearing as tolerated      Mobility  Bed Mobility Overal bed mobility: Needs Assistance Bed Mobility: Supine to Sit     Supine to sit: Min assist     General bed mobility comments: Indep to come to long sitting, minA for RLE management  Transfers Overall transfer level: Needs assistance Equipment used: Rolling walker (2 wheeled) Transfers: Sit to/from Stand Sit to Stand: Min guard         General transfer comment: Cues for hand placement, min guard for balance; limited WB through RLE  Ambulation/Gait Ambulation/Gait assistance: Min guard Gait Distance (Feet): 4 Feet Assistive device: Rolling walker (2 wheeled) Gait Pattern/deviations: Step-to pattern;Decreased weight shift to right;Antalgic Gait velocity: Decreased   General Gait Details: Amb  short distance from bed to recliner with RW and min guard, limited WBAT through RLE due to pain; denies dizziness, but endorses feeling "woozy" from pain medicine  Stairs            Wheelchair Mobility    Modified Rankin (Stroke Patients Only)       Balance Overall balance assessment: Needs assistance   Sitting balance-Leahy Scale: Good       Standing balance-Leahy Scale: Poor                               Pertinent Vitals/Pain Pain Assessment: Faces Faces Pain Scale: Hurts even more Pain Location: R knee Pain Descriptors / Indicators: Sore;Guarding;Grimacing Pain Intervention(s): Monitored during session;Premedicated before session;Repositioned    Home Living Family/patient expects to be discharged to:: Private residence Living Arrangements: Spouse/significant other Available Help at Discharge: Family;Available 24 hours/day Type of Home: House Home Access: Stairs to enter Entrance Stairs-Rails: Psychiatric nurse of Steps: 2 Home Layout: Two level;Able to live on main level with bedroom/bathroom Home Equipment: Crutches Additional Comments: Wife available for 24/7    Prior Function Level of Independence: Independent         Comments: Works from home, self-employed. Enjoys disc golf     Hand Dominance        Extremity/Trunk Assessment   Upper Extremity Assessment Upper Extremity Assessment: Overall WFL for tasks assessed    Lower Extremity Assessment Lower Extremity Assessment: RLE deficits/detail RLE Deficits / Details: s/p R TKA; able to minimally flex hip, unable to perform SLR    Cervical / Trunk Assessment Cervical / Trunk Assessment: Normal  Communication  Communication: No difficulties  Cognition Arousal/Alertness: Awake/alert Behavior During Therapy: WFL for tasks assessed/performed Overall Cognitive Status: Within Functional Limits for tasks assessed                                         General Comments      Exercises     Assessment/Plan    PT Assessment Patient needs continued PT services  PT Problem List Decreased strength;Decreased range of motion;Decreased activity tolerance;Decreased balance;Decreased mobility;Decreased knowledge of use of DME;Decreased knowledge of precautions;Pain       PT Treatment Interventions DME instruction;Gait training;Stair training;Functional mobility training;Therapeutic activities;Therapeutic exercise;Balance training;Patient/family education    PT Goals (Current goals can be found in the Care Plan section)  Acute Rehab PT Goals Patient Stated Goal: Decreased pain PT Goal Formulation: With patient Time For Goal Achievement: 08/31/19 Potential to Achieve Goals: Good    Frequency 7X/week   Barriers to discharge        Co-evaluation               AM-PAC PT "6 Clicks" Mobility  Outcome Measure Help needed turning from your back to your side while in a flat bed without using bedrails?: A Little Help needed moving from lying on your back to sitting on the side of a flat bed without using bedrails?: A Little Help needed moving to and from a bed to a chair (including a wheelchair)?: A Little Help needed standing up from a chair using your arms (e.g., wheelchair or bedside chair)?: A Little Help needed to walk in hospital room?: A Little Help needed climbing 3-5 steps with a railing? : A Little 6 Click Score: 18    End of Session   Activity Tolerance: Patient tolerated treatment well;Patient limited by pain Patient left: in chair;with call bell/phone within reach Nurse Communication: Mobility status PT Visit Diagnosis: Other abnormalities of gait and mobility (R26.89);Pain Pain - Right/Left: Right Pain - part of body: Knee    Time: 1610-9604 PT Time Calculation (min) (ACUTE ONLY): 21 min   Charges:   PT Evaluation $PT Eval Low Complexity: 1 Low     Ina Homes, PT, DPT Acute Rehabilitation Services   Pager [phone] Office [phone]  Malachy Chamber 08/17/2019, 5:49 PM

## 2019-08-17 NOTE — Care Management (Signed)
CM consult acknowledged to assist with any HH/DME needs. Awaiting PT/OT eval for DCP recommendations and will continue to follow.  Lesleyanne Politte RN, BSN, NCM-BC, ACM-RN [phone] 

## 2019-08-17 NOTE — Anesthesia Procedure Notes (Signed)
Anesthesia Regional Block: Adductor canal block   Pre-Anesthetic Checklist: ,, timeout performed, Correct Patient, Correct Site, Correct Laterality, Correct Procedure, Correct Position, site marked, Risks and benefits discussed, pre-op evaluation,  At surgeon's request and post-op pain management  Laterality: Right  Prep: Maximum Sterile Barrier Precautions used, chloraprep       Needles:  Injection technique: Single-shot  Needle Type: Echogenic Stimulator Needle     Needle Length: 9cm  Needle Gauge: 22     Additional Needles:   Procedures:,,,, ultrasound used (permanent image in chart),,,,  Narrative:  Start time: 08/17/2019 7:58 AM End time: 08/17/2019 8:00 AM Injection made incrementally with aspirations every 5 mL.  Performed by: Personally  Anesthesiologist: Brennan Bailey, MD  Additional Notes: Risks, benefits, and alternative discussed. Patient gave consent for procedure. Patient prepped and draped in sterile fashion. Sedation administered, patient remains easily responsive to voice. Relevant anatomy identified with ultrasound guidance. Local anesthetic given in 5cc increments with no signs or symptoms of intravascular injection. No pain or paraesthesias with injection. Patient monitored throughout procedure with signs of LAST or immediate complications. Tolerated well. Ultrasound image placed in chart.  Tawny Asal, MD

## 2019-08-17 NOTE — Anesthesia Procedure Notes (Signed)
Spinal  Patient location during procedure: OR Start time: 08/17/2019 8:45 AM End time: 08/17/2019 8:48 AM Staffing Anesthesiologist: Brennan Bailey, MD Performed: anesthesiologist  Preanesthetic Checklist Completed: patient identified, surgical consent, pre-op evaluation, timeout performed, IV checked, risks and benefits discussed and monitors and equipment checked Spinal Block Patient position: sitting Prep: site prepped and draped and DuraPrep Patient monitoring: continuous pulse ox, blood pressure and heart rate Approach: midline Location: L3-4 Injection technique: single-shot Needle Needle type: Pencan  Needle gauge: 24 G Needle length: 9 cm Additional Notes Risks, benefits, and alternative discussed. Patient gave consent to procedure. Prepped and draped in sitting position. Patient sedated but responsive to voice. Clear CSF obtained after 2 attempts, both L3-4. Positive terminal aspiration. No pain or paraesthesias with injection. Patient tolerated procedure well. Vital signs stable. Tawny Asal, MD

## 2019-08-17 NOTE — Transfer of Care (Signed)
Immediate Anesthesia Transfer of Care Note  Patient: Tilden Fossa  Procedure(s) Performed: RIGHT TOTAL KNEE ARTHROPLASTY (Right Knee)  Patient Location: PACU  Anesthesia Type:Regional and Spinal  Level of Consciousness: awake and alert   Airway & Oxygen Therapy: Patient Spontanous Breathing and Patient connected to face mask oxygen  Post-op Assessment: Report given to RN and Post -op Vital signs reviewed and stable  Post vital signs: Reviewed and stable  Last Vitals:  Vitals Value Taken Time  BP 117/67 08/17/19 1025  Temp    Pulse 70 08/17/19 1027  Resp 16 08/17/19 1027  SpO2 97 % 08/17/19 1027  Vitals shown include unvalidated device data.  Last Pain:  Vitals:   08/17/19 1025  TempSrc:   PainSc: (P) 0-No pain         Complications: No apparent anesthesia complications

## 2019-08-17 NOTE — Discharge Instructions (Signed)

## 2019-08-17 NOTE — Op Note (Signed)
DATE OF SURGERY:  08/17/2019  TIME: 10:31 AM  PATIENT NAME:  Tyler [name]    AGE: 53 y.o.    PRE-OPERATIVE DIAGNOSIS:  Osteoarthritis Right Knee  POST-OPERATIVE DIAGNOSIS:  Osteoarthritis Right Knee  PROCEDURE:  Procedure(s): RIGHT TOTAL KNEE ARTHROPLASTY  SURGEON: Meridee Score  ASSISTANT: Kendra  OPERATIVE IMPLANTS: Depuy , Posterior Stabilized.  Femur size 6, Tibia size 7, Patella size 35 3-peg oval button, with a 6 mm polyethylene insert.  @ENCIMAGES @        PREOPERATIVE INDICATIONS:   Tyler [name] is a 53 y.o. year old male with end stage degenerative arthritis of the knee who failed conservative treatment and elected for Total Knee Arthroplasty.   The risks, benefits, and alternatives were discussed at length including but not limited to the risks of infection, bleeding, nerve injury, stiffness, blood clots, the need for revision surgery, cardiopulmonary complications, among others, and they were willing to proceed.  OPERATIVE DESCRIPTION:  The patient was brought to the operative room and placed in a supine position.  General anesthesia was administered.  IV antibiotics were given.  The lower extremity was prepped and draped in the usual sterile fashion.  [name] was used to cover all exposed skin. Time out was performed.    Anterior quadriceps tendon splitting approach was performed.  The patella was everted and osteophytes were removed.  The anterior horn of the medial and lateral meniscus was removed.   The distal femur was opened with the drill and the intramedullary distal femoral cutting jig was utilized, set at 5 degrees valgus resecting 9 mm off the distal femur.  Care was taken to protect the collateral ligaments.  Then the extramedullary tibial cutting jig was utilized set for 3 degree posterior slope.  Care was taken during the cut to protect the medial and collateral ligaments.  The proximal tibia was removed along with the posterior horns  of the menisci.  The PCL was sacrificed.    The extensor gap was measured and was approximately 6 mm.    The distal femoral sizing jig was applied, taking care to avoid notching.  Then the 4-in-1 cutting jig was applied and the anterior and posterior femur was cut, along with the chamfer cuts.  All posterior osteophytes were removed.  The flexion gap was then measured and was symmetric with the extension gap.  The distal femoral preparation using the appropriate jig to prepare the box.  The patella was then measured, and cut with the saw.    The proximal tibia sized and prepared accordingly with the reamer and the punch, and then all components were trialed with the poly insert.  The knee was found to have stable balance and full motion.  The knee was irrigated with normal saline and the knee was soaked with TXA.  The above named components were then cemented into place and all excess cement was removed.  The final polyethylene component was in place during cementation.  The knee was kept in extension until the cement hardened.  The knee was then taken through a range of motion and the patella tracked well and the knee irrigated copiously and the parapatellar and subcutaneous tissue closed with vicryl, and skin closed with staples..  A sterile dressing was applied and patient  was taken to the PACU in stable  condition.  There were no complications.  Total tourniquet time was 36 minutes.

## 2019-08-17 NOTE — H&P (Signed)
TOTAL KNEE ADMISSION H&P  Patient is being admitted for right total knee arthroplasty.  Subjective:  Chief Complaint:right knee pain.  HPI: Tyler [name], 53 y.o. male, has a history of pain and functional disability in the right knee due to arthritis and has failed non-surgical conservative treatments for greater than 12 weeks to includeNSAID's and/or analgesics, corticosteriod injections, use of assistive devices, weight reduction as appropriate and activity modification.  Onset of symptoms was abrupt, starting 8 years ago with gradually worsening course since that time. The patient noted no past surgery on the right knee(s).  Patient currently rates pain in the right knee(s) at 8 out of 10 with activity. Patient has night pain, worsening of pain with activity and weight bearing, pain that interferes with activities of daily living, pain with passive range of motion, crepitus and joint swelling.  Patient has evidence of subchondral cysts, subchondral sclerosis, periarticular osteophytes, joint subluxation and joint space narrowing by imaging studies. This patient has had avascular necrosis of the knee. There is no active infection.  There are no active problems to display for this patient.  Past Medical History:  Diagnosis Date  . Hypothyroid   . Pneumonia     Past Surgical History:  Procedure Laterality Date  . HAND TENDON SURGERY Left   . QUADRICEPS TENDON REPAIR Left 06/13/2015   Procedure: REPAIR LEFT QUADRICEP TENDON;  Surgeon: Newt Minion, MD;  Location: [facility];  Service: Orthopedics;  Laterality: Left;    Current Facility-Administered Medications  Medication Dose Route Frequency Provider Last Rate Last Dose  . ceFAZolin (ANCEF) 2-4 GM/100ML-% IVPB           . ceFAZolin (ANCEF) IVPB 2g/100 mL premix  2 g Intravenous On Call to OR Carolan Avedisian, Bevely Palmer, PA      . chlorhexidine (HIBICLENS) 4 % liquid 4 application  60 mL Topical Once Nikeisha Klutz, Bevely Palmer, PA      . povidone-iodine  10 % swab 2 application  2 application Topical Once Sakiya Stepka, Bevely Palmer, PA       No Known Allergies  Social History   Tobacco Use  . Smoking status: Former Smoker    Quit date: 06/12/1995    Years since quitting: 24.1  . Smokeless tobacco: Never Used  Substance Use Topics  . Alcohol use: Yes    Comment: 6 drinks/week    Family History  Problem Relation Age of Onset  . Heart attack Father      Review of Systems  All other systems reviewed and are negative.   Objective:  Physical Exam  Vital signs in last 24 hours: Temp:  [97.8 F (36.6 C)] 97.8 F (36.6 C) (11/11 5732) Pulse Rate:  [52] 52 (11/11 0633) Resp:  [18] 18 (11/11 0633) BP: (142)/(95) 142/95 (11/11 0633) SpO2:  [99 %] 99 % (11/11 2025) Weight:  [99.8 kg] 99.8 kg (11/11 0633)  Labs:   Estimated body mass index is 33.45 kg/m as calculated from the following:   Height as of this encounter: 5\' 8"  (1.727 m).   Weight as of this encounter: 99.8 kg.   Imaging Review Plain radiographs demonstrate moderate degenerative joint disease of the right knee(s). The overall alignment ismild varus. The bone quality appears to be adequate for age and reported activity level.      Assessment/Plan:  End stage arthritis, right knee   The patient history, physical examination, clinical judgment of the provider and imaging studies are consistent with end stage degenerative joint disease of  the right knee(s) and total knee arthroplasty is deemed medically necessary. The treatment options including medical management, injection therapy arthroscopy and arthroplasty were discussed at length. The risks and benefits of total knee arthroplasty were presented and reviewed. The risks due to aseptic loosening, infection, stiffness, patella tracking problems, thromboembolic complications and other imponderables were discussed. The patient acknowledged the explanation, agreed to proceed with the plan and consent was signed. Patient is  being admitted for inpatient treatment for surgery, pain control, PT, OT, prophylactic antibiotics, VTE prophylaxis, progressive ambulation and ADL's and discharge planning. The patient is planning to be discharged home with home health services     Patient's anticipated LOS is less than 2 midnights, meeting these requirements: - Younger than 81 - Lives within 1 hour of care - Has a competent adult at home to recover with post-op recover - NO history of  - Chronic pain requiring opiods  - Diabetes  - Coronary Artery Disease  - Heart failure  - Heart attack  - Stroke  - DVT/VTE  - Cardiac arrhythmia  - Respiratory Failure/COPD  - Renal failure  - Anemia  - Advanced Liver disease

## 2019-08-17 NOTE — Anesthesia Postprocedure Evaluation (Signed)
Anesthesia Post Note  Patient: Tilden Fossa  Procedure(s) Performed: RIGHT TOTAL KNEE ARTHROPLASTY (Right Knee)     Patient location during evaluation: PACU Anesthesia Type: Spinal Level of consciousness: awake and alert and oriented Pain management: pain level controlled Vital Signs Assessment: post-procedure vital signs reviewed and stable Respiratory status: spontaneous breathing, nonlabored ventilation and respiratory function stable Cardiovascular status: blood pressure returned to baseline Postop Assessment: no apparent nausea or vomiting, spinal receding, no headache and no backache Anesthetic complications: no    Last Vitals:  Vitals:   08/17/19 1155 08/17/19 1200  BP: 110/75   Pulse: (!) 45 (!) 49  Resp: (!) 9 14  Temp:    SpO2: 99% 100%    Last Pain:  Vitals:   08/17/19 1200  TempSrc:   PainSc: 0-No pain                 Brennan Bailey

## 2019-08-18 ENCOUNTER — Telehealth: Payer: Self-pay | Admitting: Radiology

## 2019-08-18 ENCOUNTER — Encounter (HOSPITAL_COMMUNITY): Payer: Self-pay | Admitting: General Practice

## 2019-08-18 MED ORDER — OXYCODONE HCL 10 MG PO TABS: 10.0000 mg | ORAL_TABLET | ORAL | 0 refills | Status: DC | PRN

## 2019-08-18 MED ORDER — DOCUSATE SODIUM 100 MG PO CAPS: 100.0000 mg | ORAL_CAPSULE | Freq: Two times a day (BID) | ORAL | 0 refills | Status: DC

## 2019-08-18 MED ORDER — ACETAMINOPHEN 325 MG PO TABS: 325.0000 mg | ORAL_TABLET | Freq: Four times a day (QID) | ORAL | Status: DC | PRN

## 2019-08-18 MED ORDER — OXYCODONE-ACETAMINOPHEN 5-325 MG PO TABS: 1.0000 | ORAL_TABLET | ORAL | 0 refills | Status: DC | PRN

## 2019-08-18 MED ORDER — ASPIRIN 325 MG PO TBEC: 325.0000 mg | DELAYED_RELEASE_TABLET | Freq: Every day | ORAL | 0 refills | Status: DC

## 2019-08-18 NOTE — Telephone Encounter (Signed)
Called and lm on vm to notify pharm of change to call with any questions.

## 2019-08-18 NOTE — Progress Notes (Signed)
Physical Therapy Treatment Patient Details Name: Tyler [name] MRN: [id number] DOB: 29-Sep-1966 Today's Date: 08/18/2019    History of Present Illness Pt is a 53 y.o. male s/p elective R TKA on 08/17/19. PMH includes L quadriceps tendon repair (2016).    PT Comments    Pt in bed upon PT arrival with spouse present and is agreeable to PT. Pt was able to demo good bed mobility with minA from PT for management of RLE. Pt was then able to stand and ambulate in the hall with good management of RW and supervision for safety. Pt still demos poor gait pattern (step-to with poor wt acceptance onto RLE and slowed speed), but was able to navigate 1 curb x2 and 3 steps x2 with use of 2 railings and min guard. Pt and wife educated on technique and verbalize understanding. Additionally, pt was educated on exercise program, frequency, and intensity until progression of therapy with OPPT. Pt will continue to benefit from skilled PT to maximize independence and mobility.    Follow Up Recommendations  Outpatient PT;Follow surgeon's recommendation for DC plan and follow-up therapies     Equipment Recommendations  Rolling walker with 5" wheels;3in1 (PT)    Recommendations for Other Services       Precautions / Restrictions Precautions Precautions: Fall;Knee Precaution Booklet Issued: No Precaution Comments: Verbally reviewed precautions Restrictions Weight Bearing Restrictions: Yes RLE Weight Bearing: Weight bearing as tolerated    Mobility  Bed Mobility Overal bed mobility: Needs Assistance Bed Mobility: Supine to Sit     Supine to sit: Min assist;HOB elevated     General bed mobility comments: minA for RLE management with bed rails and elevated HOB  Transfers Overall transfer level: Needs assistance Equipment used: Rolling walker (2 wheeled) Transfers: Sit to/from Stand Sit to Stand: Min guard         General transfer comment: cues for technique with RW min guard for  safety  Ambulation/Gait Ambulation/Gait assistance: Min guard Gait Distance (Feet): 50 Feet Assistive device: Rolling walker (2 wheeled) Gait Pattern/deviations: Step-to pattern;Decreased weight shift to right;Antalgic Gait velocity: 0.76m/s Gait velocity interpretation: <1.31 ft/sec, indicative of household ambulator General Gait Details: Pt able to ambulate to PT gym (50 ft x 2) to practice stairs, Pt with heavy reliance on BUE for wt bearing.   Stairs Stairs: Yes Stairs assistance: Min guard Stair Management: Two rails;Step to pattern;Forwards Number of Stairs: 3 General stair comments: 1 curb x2; 3 steps x2. Good demo of understanding of technique. Discussed guarding with pt and wife, verbalize understanding   Wheelchair Mobility    Modified Rankin (Stroke Patients Only)       Balance Overall balance assessment: Needs assistance Sitting-balance support: No upper extremity supported Sitting balance-Leahy Scale: Good     Standing balance support: Bilateral upper extremity supported;During functional activity Standing balance-Leahy Scale: Poor Standing balance comment: single UE during static stand, BUE during dynamic activity                            Cognition Arousal/Alertness: Awake/alert Behavior During Therapy: WFL for tasks assessed/performed;Anxious Overall Cognitive Status: Within Functional Limits for tasks assessed                                        Exercises Total Joint Exercises Quad Sets: AROM;Both;5 reps;Seated Knee Flexion: AROM;Right;5 reps;Seated Other Exercises Other  Exercises: Seated Calf raises, both 2 x 10    General Comments        Pertinent Vitals/Pain Pain Assessment: 0-10 Pain Score: 4  Faces Pain Scale: Hurts even more Pain Location: R knee in WB positions Pain Descriptors / Indicators: Sore;Guarding;Grimacing Pain Intervention(s): Limited activity within patient's tolerance;Monitored during  session;RN gave pain meds during session;Repositioned    Home Living Family/patient expects to be discharged to:: Private residence Living Arrangements: Spouse/significant other Available Help at Discharge: Family;Available 24 hours/day Type of Home: House Home Access: Stairs to enter Entrance Stairs-Rails: Right;Left Home Layout: Two level;Able to live on main level with bedroom/bathroom Home Equipment: Crutches Additional Comments: Wife available for 24/7    Prior Function Level of Independence: Independent      Comments: Works from home, self-employed. Enjoys disc golf   PT Goals (current goals can now be found in the care plan section) Acute Rehab PT Goals Patient Stated Goal: Decreased pain PT Goal Formulation: With patient/family Time For Goal Achievement: 08/31/19 Potential to Achieve Goals: Good Progress towards PT goals: Progressing toward goals    Frequency    7X/week      PT Plan Current plan remains appropriate    Co-evaluation              AM-PAC PT "6 Clicks" Mobility   Outcome Measure  Help needed turning from your back to your side while in a flat bed without using bedrails?: A Little Help needed moving from lying on your back to sitting on the side of a flat bed without using bedrails?: A Little Help needed moving to and from a bed to a chair (including a wheelchair)?: A Little Help needed standing up from a chair using your arms (e.g., wheelchair or bedside chair)?: A Little Help needed to walk in hospital room?: A Little Help needed climbing 3-5 steps with a railing? : A Little 6 Click Score: 18    End of Session Equipment Utilized During Treatment: Gait belt Activity Tolerance: Patient tolerated treatment well;Patient limited by pain Patient left: in chair;with call bell/phone within reach;with family/visitor present Nurse Communication: Mobility status(d/c plan) PT Visit Diagnosis: Other abnormalities of gait and mobility  (R26.89);Pain Pain - Right/Left: Right Pain - part of body: Knee     Time: 5993-5701 PT Time Calculation (min) (ACUTE ONLY): 39 min  Charges:  $Gait Training: 23-37 mins $Therapeutic Exercise: 8-22 mins                     Tyler [name], PT, DPT   Acute Rehabilitation Department [phone]   Tyler [name] 08/18/2019, 11:35 AM

## 2019-08-18 NOTE — Discharge Summary (Signed)
Discharge Diagnoses:  Active Problems:   Arthritis of right knee   Surgeries: Procedure(s): RIGHT TOTAL KNEE ARTHROPLASTY on 08/17/2019    Consultants:   Discharged Condition: Improved  Hospital Course: VALOR QUAINTANCE is an 53 y.o. male who was admitted 08/17/2019 with a chief complaint of Right knee pain, with a final diagnosis of Osteoarthritis Right Knee.  Patient was brought to the operating room on 08/17/2019 and underwent Procedure(s): RIGHT TOTAL KNEE ARTHROPLASTY.    Patient was given perioperative antibiotics:  Anti-infectives (From admission, onward)   Start     Dose/Rate Route Frequency Ordered Stop   08/17/19 1645  ceFAZolin (ANCEF) IVPB 1 g/50 mL premix     1 g 100 mL/hr over 30 Minutes Intravenous Every 6 hours 08/17/19 1509 08/17/19 2212   08/17/19 0645  ceFAZolin (ANCEF) IVPB 2g/100 mL premix     2 g 200 mL/hr over 30 Minutes Intravenous On call to O.R. 08/17/19 1950 08/17/19 0847   08/17/19 0637  ceFAZolin (ANCEF) 2-4 GM/100ML-% IVPB    Note to Pharmacy: Alvy Beal   : cabinet override      08/17/19 9326 08/17/19 7124    .  Patient was given sequential compression devices, early ambulation, and aspirin for DVT prophylaxis.  Recent vital signs:  Patient Vitals for the past 24 hrs:  BP Temp Temp src Pulse Resp SpO2  08/18/19 0339 126/79 99 F (37.2 C) Oral 75 18 97 %  08/17/19 1927 (!) 136/94 97.8 F (36.6 C) Oral 77 18 96 %  08/17/19 1510 - - - - - 100 %  08/17/19 1501 123/90 97.6 F (36.4 C) Oral (!) 58 - 100 %  08/17/19 1425 132/90 - - 76 20 99 %  08/17/19 1400 128/88 - - 63 16 98 %  08/17/19 1300 116/80 - - (!) 51 12 99 %  08/17/19 1200 - - - (!) 49 14 100 %  08/17/19 1155 110/75 - - (!) 45 (!) 9 99 %  08/17/19 1140 116/77 - - (!) 43 (!) 8 100 %  08/17/19 1125 116/76 - - (!) 50 10 98 %  08/17/19 1110 109/73 - - (!) 47 11 100 %  08/17/19 1055 103/74 - - 60 14 100 %  08/17/19 1040 110/70 - - 60 12 98 %  08/17/19 1025 117/67 (!) 97 F  (36.1 C) - 72 12 98 %  .  Recent laboratory studies: No results found.  Discharge Medications:   Allergies as of 08/18/2019   No Known Allergies     Medication List    TAKE these medications   acetaminophen 325 MG tablet Commonly known as: TYLENOL Take 1-2 tablets (325-650 mg total) by mouth every 6 (six) hours as needed for mild pain (pain score 1-3 or temp > 100.5).   aspirin 325 MG EC tablet Take 1 tablet (325 mg total) by mouth daily with breakfast.   docusate sodium 100 MG capsule Commonly known as: COLACE Take 1 capsule (100 mg total) by mouth 2 (two) times daily.   levothyroxine 200 MCG tablet Commonly known as: SYNTHROID Take 200 mcg by mouth daily.   Oxycodone HCl 10 MG Tabs Take 1-1.5 tablets (10-15 mg total) by mouth every 4 (four) hours as needed for severe pain (pain score 7-10).            Discharge Care Instructions  (From admission, onward)         Start     Ordered   08/18/19 0000  Change  dressing    Comments: Change dressing if wet or soiled.   08/18/19 0711          Diagnostic Studies: No results found.  Patient benefited maximally from their hospital stay and there were no complications.     Disposition: Discharge disposition: 01-Home or Self Care      Discharge Instructions    Call MD / Call 911   Complete by: As directed    If you experience chest pain or shortness of breath, CALL 911 and be transported to the hospital emergency room.  If you develope a fever above 101 F, pus (white drainage) or increased drainage or redness at the wound, or calf pain, call your surgeon's office.   Change dressing   Complete by: As directed    Change dressing if wet or soiled.   Constipation Prevention   Complete by: As directed    Drink plenty of fluids.  Prune juice may be helpful.  You may use a stool softener, such as Colace (over the counter) 100 mg twice a day.  Use MiraLax (over the counter) for constipation as needed.   Diet - low  sodium heart healthy   Complete by: As directed    Increase activity slowly as tolerated   Complete by: As directed      Follow-up Information    Nadara Mustard, MD Follow up in 1 week(s).   Specialty: Orthopedic Surgery Contact information: [address] [place] [address] [phone]            Signed: [place] Marcha Licklider 08/18/2019, 7:12 AM

## 2019-08-18 NOTE — TOC Transition Note (Signed)
Transition of Care [facility]) - CM/SW Discharge Note   Patient Details  Name: Tyler [name] MRN: [id number] Date of Birth: Mar 02, 1966  Transition of Care [facility] CM/SW Contact:  Midge Minium RN, BSN, NCM-BC, ACM-RN [phone] Phone Number: 08/18/2019, 11:52 AM   Clinical Narrative:    CM following for TOC needs. CM spoke to the patient to discuss the POC. Patient is a 53 y.o. male s/p elective R TKA on 08/17/19. PMH includes L quadriceps tendon repair (2016). Lived at home with his spouse and was independent PTA; patient stated that his spouse will be available to provide 24hr assistance post-discharge. PT eval completed with outpatient PT and DME recommended, with the patient agreeable. DME preference provided with no preference for RW and BSC. DME referral given to Zack ([employer] liaison); AVS updated. CM discussed outpatient PT options with [facility] selected; CM sent the outpatient PT referral and documented on the AVS. No further needs from CM.    Final next level of care: OP Rehab Barriers to Discharge: No Barriers Identified   Patient Goals and CMS Choice Patient states their goals for this hospitalization and ongoing recovery are:: "decreased pain" CMS Medicare.gov Compare Post Acute Care list provided to:: Patient Choice offered to / list presented to : Patient   Discharge Plan and Services                DME Arranged: Bedside commode, Walker rolling DME Agency: [employer] Date DME Agency Contacted: 08/18/19 Time DME Agency Contacted: [id number] Representative spoke with at DME Agency: [place] (liaison) [place] Arranged: NA [place] Agency: NA        Social Determinants of Health ([place]) Interventions     Readmission Risk Interventions No flowsheet data found.

## 2019-08-18 NOTE — Progress Notes (Signed)
Patient ID: Tyler [name], male   DOB: August 18, 1966, 53 y.o.   MRN: [id number]  Patient's anticipated LOS is less than 2 midnights, meeting these requirements: - Younger than 56 - Lives within 1 hour of care - Has a competent adult at home to recover with post-op recover - NO history of  - Chronic pain requiring opiods  - Diabetes  - Coronary Artery Disease  - Heart failure  - Heart attack  - Stroke  - DVT/VTE  - Cardiac arrhythmia  - Respiratory Failure/COPD  - Renal failure  - Anemia  - Advanced Liver disease

## 2019-08-18 NOTE — Telephone Encounter (Signed)
Order changed.  Please call the pharmacy and notify them that the original Percocet order has been discontinued and there is a new order.

## 2019-08-18 NOTE — Telephone Encounter (Signed)
Cecille Rubin from [facility] called. Due to policy cannot fill oxycodone Rx, calculates to 135 morphine equivalent, cannot fill above 50 morphine equivalent.  Max dose would need to be changed to 3 tablets per day or maybe send to different pharmacy.  Callback # [phone] Cecille Rubin

## 2019-08-18 NOTE — Evaluation (Signed)
Occupational Therapy Evaluation Patient Details Name: Tyler [name] MRN: [id number] DOB: November 03, 1965 Today's Date: 08/18/2019    History of Present Illness Pt is a 53 y.o. male s/p elective R TKA on 08/17/19. PMH includes L quadriceps tendon repair (2016).   Clinical Impression   Pt admitted with the above diagnoses and presents with below problem list. At baseline, pt is independent with ADLs. Pt presents with expected decreased in indepedence with ADLs s/p TKA. Pt currently setup to min guard with ADLs. All ADL education regarding knee precautions completed with pt and spouse. No further acute OT needs indicated. D/c home planned for today.    Follow Up Recommendations  No OT follow up;Supervision - Intermittent(OOB/mobility)    Equipment Recommendations  3 in 1 bedside commode    Recommendations for Other Services       Precautions / Restrictions Precautions Precautions: Fall;Knee Precaution Booklet Issued: No Precaution Comments: Verbally reviewed precautions Restrictions Weight Bearing Restrictions: Yes RLE Weight Bearing: Weight bearing as tolerated      Mobility Bed Mobility               General bed mobility comments: up in chair  Transfers Overall transfer level: Needs assistance Equipment used: Rolling walker (2 wheeled) Transfers: Sit to/from Stand Sit to Stand: Min guard         General transfer comment: cues for technique with rw. min guard for safety    Balance Overall balance assessment: Needs assistance Sitting-balance support: No upper extremity supported Sitting balance-Leahy Scale: Good       Standing balance-Leahy Scale: Poor                             ADL either performed or assessed with clinical judgement   ADL Overall ADL's : Needs assistance/impaired Eating/Feeding: Set up;Sitting   Grooming: Min guard;Standing;Sitting;Set up   Upper Body Bathing: Set up;Sitting   Lower Body Bathing: Min guard;Sit  to/from stand   Upper Body Dressing : Set up;Sitting   Lower Body Dressing: Min guard;Sit to/from stand   Toilet Transfer: Min guard;Ambulation;RW   Toileting- Architect and Hygiene: Min guard;Sit to/from stand   Tub/ Shower Transfer: Walk-in shower;Min guard;Ambulation;Rolling walker   Functional mobility during ADLs: Min guard;Rolling walker General ADL Comments: Pt completed in room functional mobility and toilet transfer during session. Spouse present and invovled throughout. Provided handout on using 3n1 as tub shower sear.     Vision         Perception     Praxis      Pertinent Vitals/Pain Pain Assessment: Faces Faces Pain Scale: Hurts even more Pain Location: R knee in WB positions Pain Descriptors / Indicators: Sore;Guarding;Grimacing Pain Intervention(s): Monitored during session;Limited activity within patient's tolerance;Repositioned     Hand Dominance     Extremity/Trunk Assessment Upper Extremity Assessment Upper Extremity Assessment: Overall WFL for tasks assessed   Lower Extremity Assessment Lower Extremity Assessment: Defer to PT evaluation   Cervical / Trunk Assessment Cervical / Trunk Assessment: Normal   Communication Communication Communication: No difficulties   Cognition Arousal/Alertness: Awake/alert Behavior During Therapy: WFL for tasks assessed/performed Overall Cognitive Status: Within Functional Limits for tasks assessed                                     General Comments       Exercises  Shoulder Instructions      Home Living Family/patient expects to be discharged to:: Private residence Living Arrangements: Spouse/significant other Available Help at Discharge: Family;Available 24 hours/day Type of Home: House Home Access: Stairs to enter [employer] of Steps: 2 Entrance Stairs-Rails: Right;Left Home Layout: Two level;Able to live on main level with bedroom/bathroom      Bathroom Shower/Tub: Teacher, early years/pre: Standard     Home Equipment: Crutches   Additional Comments: Wife available for 24/7      Prior Functioning/Environment Level of Independence: Independent        Comments: Works from home, self-employed. Enjoys disc golf        OT Problem List:        OT Treatment/Interventions:      OT Goals(Current goals can be found in the care plan section) Acute Rehab OT Goals Patient Stated Goal: Decreased pain  OT Frequency:     Barriers to D/C:            Co-evaluation              AM-PAC OT "6 Clicks" Daily Activity     Outcome Measure Help from another person eating meals?: None Help from another person taking care of personal grooming?: None Help from another person toileting, which includes using toliet, bedpan, or urinal?: A Little Help from another person bathing (including washing, rinsing, drying)?: A Little Help from another person to put on and taking off regular upper body clothing?: None Help from another person to put on and taking off regular lower body clothing?: A Little 6 Click Score: 21   End of Session Equipment Utilized During Treatment: Rolling walker  Activity Tolerance: Patient tolerated treatment well Patient left: with call bell/phone within reach;with family/visitor present;Other (comment)(on toilet)  OT Visit Diagnosis: Unsteadiness on feet (R26.81);Pain                Time: 3500-9381 OT Time Calculation (min): 16 min Charges:  OT General Charges $OT Visit: 1 Visit OT Evaluation $OT Eval Low Complexity: [place], OT Acute Rehabilitation Services Pager: [phone] Office: [phone]   Hortencia Pilar 08/18/2019, 9:29 AM

## 2019-08-18 NOTE — Progress Notes (Signed)
Physical Therapy Treatment Patient Details Name: Tyler [name] MRN: [id number] DOB: 09/27/1966 Today's Date: 08/18/2019    History of Present Illness Pt is a 53 y.o. male s/p elective R TKA on 08/17/19. PMH includes L quadriceps tendon repair (2016).    PT Comments    Pt OOB in recliner upon entry of PT, agreeable to PT session prior to d/c home. Pt was able to demo improved independence with sit-stand transfers and ambulation, improved gait pattern. Pt was educated on HEP more in-depth, and given handout of exercises. Pt and wife educated on car transfers, reviewed stair training, and both report all questions are answered at this time. Pt is safe to d/c home with wife for assist and continue therapy at OP setting to maximize recovery.    Follow Up Recommendations  Outpatient PT;Follow surgeon's recommendation for DC plan and follow-up therapies     Equipment Recommendations  Rolling walker with 5" wheels;3in1 (PT)    Recommendations for Other Services       Precautions / Restrictions Precautions Precautions: Fall;Knee Precaution Booklet Issued: No Precaution Comments: Verbally reviewed precautions Restrictions Weight Bearing Restrictions: Yes RLE Weight Bearing: Weight bearing as tolerated    Mobility  Bed Mobility Overal bed mobility: Needs Assistance Bed Mobility: Supine to Sit     Supine to sit: Min assist;HOB elevated     General bed mobility comments: pt OOB in recliner upon PT arrival  Transfers Overall transfer level: Needs assistance Equipment used: Rolling walker (2 wheeled) Transfers: Sit to/from Stand Sit to Stand: Supervision         General transfer comment: cues for technique with RW min guard for safety  Ambulation/Gait Ambulation/Gait assistance: Supervision Gait Distance (Feet): 50 Feet Assistive device: Rolling walker (2 wheeled) Gait Pattern/deviations: Step-to pattern;Decreased weight shift to right;Antalgic Gait velocity:  0.77m/s Gait velocity interpretation: <1.31 ft/sec, indicative of household ambulator General Gait Details: Pt able to ambulate 50 ft with supervision, some VCs given for gait pattern, heel-toe pattern, and stride length. pt agreeable and demos good understanding   Stairs Stairs: Yes Stairs assistance: Min guard Stair Management: Two rails;Step to pattern;Forwards Number of Stairs: 3 General stair comments: 1 curb x2; 3 steps x2. Good demo of understanding of technique. Discussed guarding with pt and wife, verbalize understanding   Wheelchair Mobility    Modified Rankin (Stroke Patients Only)       Balance Overall balance assessment: Needs assistance Sitting-balance support: No upper extremity supported Sitting balance-Leahy Scale: Good     Standing balance support: Bilateral upper extremity supported;During functional activity Standing balance-Leahy Scale: Poor Standing balance comment: single UE during static stand, BUE during dynamic activity                            Cognition Arousal/Alertness: Awake/alert Behavior During Therapy: WFL for tasks assessed/performed;Anxious Overall Cognitive Status: Within Functional Limits for tasks assessed                                        Exercises Total Joint Exercises Quad Sets: AROM;Both;5 reps;Seated Knee Flexion: AROM;Right;5 reps;Seated Other Exercises Other Exercises: Seated Calf raises, both 2 x 10    General Comments        Pertinent Vitals/Pain Pain Assessment: 0-10 Pain Score: 4  Pain Location: R knee in WB positions Pain Descriptors / Indicators: Sore;Guarding;Grimacing Pain Intervention(s): Limited activity  within patient's tolerance;Monitored during session;Patient requesting pain meds-RN notified;Repositioned    Home Living                      Prior Function            PT Goals (current goals can now be found in the care plan section) Acute Rehab PT  Goals Patient Stated Goal: Decreased pain PT Goal Formulation: With patient/family Time For Goal Achievement: 08/31/19 Potential to Achieve Goals: Good Progress towards PT goals: Progressing toward goals    Frequency    7X/week      PT Plan Current plan remains appropriate    Co-evaluation              AM-PAC PT "6 Clicks" Mobility   Outcome Measure  Help needed turning from your back to your side while in a flat bed without using bedrails?: None Help needed moving from lying on your back to sitting on the side of a flat bed without using bedrails?: None Help needed moving to and from a bed to a chair (including a wheelchair)?: A Little Help needed standing up from a chair using your arms (e.g., wheelchair or bedside chair)?: A Little Help needed to walk in hospital room?: A Little Help needed climbing 3-5 steps with a railing? : A Little 6 Click Score: 20    End of Session Equipment Utilized During Treatment: Gait belt Activity Tolerance: Patient tolerated treatment well;Patient limited by pain Patient left: in chair;with call bell/phone within reach;with family/visitor present Nurse Communication: Mobility status;Patient requests pain meds(d/c plan) PT Visit Diagnosis: Other abnormalities of gait and mobility (R26.89);Pain Pain - Right/Left: Right Pain - part of body: Knee     Time: 9381-0175 PT Time Calculation (min) (ACUTE ONLY): 19 min  Charges:  $Gait Training: 8-22 mins $Therapeutic Exercise: 8-22 mins                     Mickey Farber, PT, DPT   Acute Rehabilitation Department [phone]   Otho Bellows 08/18/2019, 2:14 PM

## 2019-08-18 NOTE — Progress Notes (Signed)
Patient discharging home today. Discharge instructions explained to patient and he verbalized understaffing. No further questions or concerns voiced.

## 2019-08-18 NOTE — Progress Notes (Signed)
Patient ID: Tyler [name], male   DOB: 1966/04/04, 53 y.o.   MRN: [id number] Patient did well with therapy anticipate discharge to home today after physical therapy.  Dressings clean and dry.  Again reinforced the importance of working on knee extension.

## 2019-08-18 NOTE — Telephone Encounter (Signed)
Please see message below. Pt is s/p a total knee replacement yesterday. Please advise what to do.

## 2019-08-24 ENCOUNTER — Encounter: Payer: Self-pay | Admitting: Physical Therapy

## 2019-08-24 ENCOUNTER — Encounter: Payer: Self-pay | Admitting: Family

## 2019-08-24 ENCOUNTER — Ambulatory Visit (INDEPENDENT_AMBULATORY_CARE_PROVIDER_SITE_OTHER): Payer: BC Managed Care – PPO | Admitting: Family

## 2019-08-24 ENCOUNTER — Ambulatory Visit: Payer: BC Managed Care – PPO | Attending: Orthopedic Surgery | Admitting: Physical Therapy

## 2019-08-24 ENCOUNTER — Other Ambulatory Visit: Payer: Self-pay

## 2019-08-24 VITALS — Ht 68.0 in | Wt 220.0 lb

## 2019-08-24 DIAGNOSIS — M25561 Pain in right knee: Secondary | ICD-10-CM

## 2019-08-24 DIAGNOSIS — Z96651 Presence of right artificial knee joint: Secondary | ICD-10-CM

## 2019-08-24 DIAGNOSIS — M25661 Stiffness of right knee, not elsewhere classified: Secondary | ICD-10-CM | POA: Diagnosis present

## 2019-08-24 DIAGNOSIS — R262 Difficulty in walking, not elsewhere classified: Secondary | ICD-10-CM | POA: Insufficient documentation

## 2019-08-24 DIAGNOSIS — R600 Localized edema: Secondary | ICD-10-CM | POA: Insufficient documentation

## 2019-08-24 NOTE — Progress Notes (Signed)
   Post-Op Visit Note   Patient: Tyler [name]           Date of Birth: 1966-02-06           MRN: [id number] Visit Date: 08/24/2019 PCP: Delilah Shan, MD  Chief Complaint:  Chief Complaint  Patient presents with  . Right Knee - Routine Post Op    08/17/19 right total knee arthroplasty     HPI:  HPI Patient is a 53 year old gentleman seen today 1 week status post right tka. Has been working on pt quite aggressively. States is walking 1/4 a mile a day at home and doing exercises. Only concerned for some swelling.   Ortho Exam Incision is well approximated with staples. No open areas. No gaping. No erythema. No drainage. Moderate swelling. No warmth.   Lacks 10 degrees of extension. Active flexion to 70. Passive to 86  Visit Diagnoses:  1. S/P total knee arthroplasty, right     Plan: compression with ace. Elevate. Ice. Continue working on pt. Follow up in 1 week with radiographs and staple removal.  Follow-Up Instructions: Return in about 1 week (around 08/31/2019).   Imaging: No results found.  Orders:  No orders of the defined types were placed in this encounter.  No orders of the defined types were placed in this encounter.    PMFS History: Patient Active Problem List   Diagnosis Date Noted  . Arthritis of right knee 08/17/2019   Past Medical History:  Diagnosis Date  . Arthritis   . Hypothyroid   . Pneumonia     Family History  Problem Relation Age of Onset  . Heart attack Father     Past Surgical History:  Procedure Laterality Date  . HAND TENDON SURGERY Left   . QUADRICEPS TENDON REPAIR Left 06/13/2015   Procedure: REPAIR LEFT QUADRICEP TENDON;  Surgeon: Newt Minion, MD;  Location: [place];  Service: Orthopedics;  Laterality: Left;  . TOTAL KNEE ARTHROPLASTY Right 08/17/2019  . TOTAL KNEE ARTHROPLASTY Right 08/17/2019   Procedure: RIGHT TOTAL KNEE ARTHROPLASTY;  Surgeon: Newt Minion, MD;  Location: [place];  Service: Orthopedics;  Laterality:  Right;   Social History   Occupational History  . Not on file  Tobacco Use  . Smoking status: Former Smoker    Quit date: 06/12/1995    Years since quitting: 24.2  . Smokeless tobacco: Never Used  Substance and Sexual Activity  . Alcohol use: Yes    Comment: 6 drinks/week  . Drug use: No  . Sexual activity: Not on file

## 2019-08-24 NOTE — Therapy (Signed)
[facility] [address]  [address] [place], [place], [address] Phone: [phone]   Fax:  [phone]  Physical Therapy Evaluation  Patient Details  Name: Tyler [name] MRN: [id number] Date of Birth: 1966/06/22 Referring Provider (PT): Aldean Baker, MD   Encounter Date: 08/24/2019  PT End of Session - 08/24/19 1051    Visit Number  1    Number of Visits  19    Date for PT Re-Evaluation  10/21/19    PT Start Time  0800    PT Stop Time  0850    PT Time Calculation (min)  50 min    Activity Tolerance  Patient tolerated treatment well;Patient limited by pain    Behavior During Therapy  [facility] for tasks assessed/performed       Past Medical History:  Diagnosis Date  . Arthritis   . Hypothyroid   . Pneumonia     Past Surgical History:  Procedure Laterality Date  . HAND TENDON SURGERY Left   . QUADRICEPS TENDON REPAIR Left 06/13/2015   Procedure: REPAIR LEFT QUADRICEP TENDON;  Surgeon: Nadara Mustard, MD;  [facility];  Service: Orthopedics;  Laterality: Left;  . TOTAL KNEE ARTHROPLASTY Right 08/17/2019  . TOTAL KNEE ARTHROPLASTY Right 08/17/2019   Procedure: RIGHT TOTAL KNEE ARTHROPLASTY;  Surgeon: Nadara Mustard, MD;  Location: [facility];  Service: Orthopedics;  Laterality: Right;    There were no vitals filed for this visit.   Subjective Assessment - 08/24/19 0803    Subjective  Pt arriving today following R TKA on 08/17/2019. Pt reporting 2/10 pain in R knee today, but pain can increase to 6-7/10 after sitting for long periods and when first waking up in the morning.    Limitations  Sitting;Walking;Standing    Patient Stated Goals  "I want to get back to hiking and play disc golf again without pain"    Currently in Pain?  Yes    Pain Score  2     Pain Location  Knee    Pain Orientation  Right    Pain Descriptors / Indicators  Aching    Pain Type  Surgical pain;Acute pain    Pain Onset  1 to 4 weeks ago     Pain Frequency  Constant    Aggravating Factors   sitting prolonged and when first waking up, transitions    Pain Relieving Factors  elevation, meds         [facility] PT Assessment - 08/24/19 0001      Assessment   Medical Diagnosis  R TKA    Referring Provider (PT)  Aldean Baker, MD    Onset Date/Surgical Date  08/17/19    Hand Dominance  Right    Next MD Visit  this week    Prior Therapy  years ago following L quad tendon repair      Precautions   Precautions  None      Restrictions   Weight Bearing Restrictions  No      Balance Screen   Has the patient fallen in the past 6 months  No    Is the patient reluctant to leave their home because of a fear of falling?   No      Home Nurse, mental health  Private residence    Living Arrangements  Spouse/significant other    Available Help at Discharge  Family    Type of Home  House  Home Access  Stairs to enter    Home Layout  Two level;Other (Comment)    Additional Comments  pt reporting he had bike in his basement, handrail to assit with navigation      Prior Function   Level of Independence  Independent    Vocation  Full time employment    Leisure  hiking, playing disc golf      Cognition   Overall Cognitive Status  Within Functional Limits for tasks assessed      Observation/Other Assessments   Focus on Therapeutic Outcomes (FOTO)   54 % limitation      Observation/Other Assessments-Edema    Edema  Circumferential      Circumferential Edema   Circumferential - Right  R thigh (20 cm from mid patella) 61.5 cm, R calf: (20 cm from mid patella) 44 cm    Circumferential - Left   L thigh (20 cm from mid patella):  57cm, L calf: (20 cm from mid patella) 40 cm      Sensation   Light Touch  Appears Intact      Posture/Postural Control   Posture/Postural Control  Postural limitations    Postural Limitations  Rounded Shoulders;Forward head      ROM / Strength   AROM / PROM / Strength  AROM;PROM;Strength       AROM   Overall AROM   Deficits    AROM Assessment Site  Knee    Right/Left Knee  Right;Left    Right Knee Extension  20    Right Knee Flexion  75    Left Knee Extension  4    Left Knee Flexion  130      PROM   Overall PROM   Deficits    PROM Assessment Site  Knee    Right/Left Knee  Right    Right Knee Extension  16    Right Knee Flexion  84      Strength   Overall Strength  Deficits    Strength Assessment Site  Knee    Right/Left Knee  Right;Left    Right Knee Flexion  3/5    Right Knee Extension  2/5    Left Knee Flexion  5/5    Left Knee Extension  5/5      Palpation   Palpation comment  TTP: medial and lateral knee joint, distal quad       Ambulation/Gait   Ambulation Distance (Feet)  50 Feet    Assistive device  Rolling walker    Gait Pattern  Step-to pattern;Decreased step length - right;Decreased step length - left;Decreased stance time - right;Decreased hip/knee flexion - right;Antalgic;Poor foot clearance - left;Poor foot clearance - right                Objective measurements completed on examination: See above findings.                PT Short Term Goals - 08/24/19 1030      PT SHORT TERM GOAL #1   Title  Pt will be independent in his HEP.    Baseline  issued initial HEP today    Time  3    Period  Weeks    Status  New    Target Date  09/15/19      PT SHORT TERM GOAL #2   Title  Pt will be able to perform active SLR with no extensor lag note and hold 10 seconds    Baseline  unable  to lift LE off the mat table    Time  3    Period  Weeks    Status  New        PT Long Term Goals - 08/24/19 1032      PT LONG TERM GOAL #1   Title  Pt will be able to walk 1 mile with no assistive device with pain </= 2/10.    Baseline  walking only short distances with RW    Time  6    Period  Weeks    Status  New    Target Date  10/06/19      PT LONG TERM GOAL #2   Title  Pt will be able to improve his R knee flexion to >/= 120  degrees in order to improve gait and functional mobillty.    Baseline  AROM: 20-75 degrees    Time  6    Period  Weeks    Status  New    Target Date  10/06/19      PT LONG TERM GOAL #3   Title  Pt will improve his R LE strength in knee flexion and extension to >/= 4/5.    Baseline  extension: 2/5, flexion: 3/5    Time  6    Period  Weeks    Status  New    Target Date  10/06/19      PT LONG TERM GOAL #4   Title  Pt will be independent in his HEP and progression.    Time  6    Period  Weeks    Status  New    Target Date  10/06/19      PT LONG TERM GOAL #5   Title  Pt will be able to actively pick up object off the ground  from standing position deomonstrating correct body mechanics.    Time  6    Period  Weeks    Status  New    Target Date  10/06/19             Plan - 08/24/19 6213    Clinical Impression Statement  Pt presenting for PT evaluation following R TKA one week ago on 08/17/2019. Pt reporting pain of 2/10 today in R knee. Pt presenting with increased swelling of 4 centimeters in thigh and calf when compared to L LE. Pt with ace bandage intact over incision. Pt reporting he goes back to see Dr. Sharol Given for staple removal this week. AROM: 20-75 degrees, PROM: 16-84 degrrees. Pt was instructed in elevation and ice multiple times each day to reduce swelling/edema. Pt also instructed in HEP in addition to pt's exercises provided by acute therpaist post op. Skilled PT needed to progress pt toward his PLOF of hiking and playing disc golf with the below interventions.    Personal Factors and Comorbidities  Comorbidity 1    Comorbidities  past surgeries on L knee, OA, hypothyroidism    Examination-Activity Limitations  Dressing;Sit;Stairs;Stand;Transfers;Squat;Lift;Sleep    Examination-Participation Restrictions  Community Activity;Driving;Other;Yard Work    Stability/Clinical Decision Making  Stable/Uncomplicated    Designer, jewellery  Low    Rehab Potential  Excellent     PT Frequency  3x / week    PT Duration  6 weeks    PT Treatment/Interventions  Cryotherapy;Electrical Stimulation;Gait training;Stair training;Balance training;Therapeutic exercise;Therapeutic activities;Functional mobility training;Neuromuscular re-education;Patient/family education;Manual techniques;Passive range of motion;Taping;Vasopneumatic Device    PT Next Visit Plan  Perform TUG or 5 time sit to stand  at next visit. Bike, LE strengtheing, stretching, Vasopneumatic with elevation    PT Home Exercise Plan  Access Code: [email or web address] (quad sets, SLR, hip abd in sidelying,    Consulted and Agree with Plan of Care  Patient       Patient will benefit from skilled therapeutic intervention in order to improve the following deficits and impairments:  Pain, Postural dysfunction, Increased edema, Decreased strength, Impaired flexibility, Decreased range of motion, Difficulty walking, Decreased balance, Decreased mobility, Abnormal gait  Visit Diagnosis: Acute pain of right knee  Stiffness of right knee, not elsewhere classified  Difficulty in walking, not elsewhere classified  Localized edema     Problem List Patient Active Problem List   Diagnosis Date Noted  . Arthritis of right knee 08/17/2019    Sharmon LeydenJennifer R Tarsha Blando, PT 08/24/2019, 10:52 AM  [facility] [address]  [address] [place], [place], [address] Phone: [phone]   Fax:  [phone]  Name: Tyler [name] MRN: [id number] Date of Birth: 01-Jan-1966

## 2019-08-30 ENCOUNTER — Other Ambulatory Visit: Payer: Self-pay

## 2019-08-30 ENCOUNTER — Ambulatory Visit: Payer: BC Managed Care – PPO

## 2019-08-30 DIAGNOSIS — R262 Difficulty in walking, not elsewhere classified: Secondary | ICD-10-CM

## 2019-08-30 DIAGNOSIS — R600 Localized edema: Secondary | ICD-10-CM

## 2019-08-30 DIAGNOSIS — M25661 Stiffness of right knee, not elsewhere classified: Secondary | ICD-10-CM

## 2019-08-30 DIAGNOSIS — M25561 Pain in right knee: Secondary | ICD-10-CM | POA: Diagnosis not present

## 2019-08-30 NOTE — Therapy (Signed)
[facility] [address]  [address] [place], [place], [address] Phone: [phone]   Fax:  [phone]  Physical Therapy Treatment  Patient Details  Name: Tyler [name] MRN: [id number] Date of Birth: November 06, 1965 Referring Provider (PT): Aldean Baker, MD   Encounter Date: 08/30/2019  PT End of Session - 08/30/19 0808    Visit Number  2    Number of Visits  19    Date for PT Re-Evaluation  10/21/19    PT Start Time  0801    PT Stop Time  0855    PT Time Calculation (min)  54 min    Activity Tolerance  Patient tolerated treatment well;Patient limited by pain    Behavior During Therapy  [facility] for tasks assessed/performed       Past Medical History:  Diagnosis Date  . Arthritis   . Hypothyroid   . Pneumonia     Past Surgical History:  Procedure Laterality Date  . HAND TENDON SURGERY Left   . QUADRICEPS TENDON REPAIR Left 06/13/2015   Procedure: REPAIR LEFT QUADRICEP TENDON;  Surgeon: Nadara Mustard, MD;  [facility];  Service: Orthopedics;  Laterality: Left;  . TOTAL KNEE ARTHROPLASTY Right 08/17/2019  . TOTAL KNEE ARTHROPLASTY Right 08/17/2019   Procedure: RIGHT TOTAL KNEE ARTHROPLASTY;  Surgeon: Nadara Mustard, MD;  Location: [facility] OR;  Service: Orthopedics;  Laterality: Right;    There were no vitals filed for this visit.  Subjective Assessment - 08/30/19 0807    Subjective  Has been performing HEP 3x/day.    Patient Stated Goals  "I want to get back to hiking and play disc golf again without pain"    Currently in Pain?  Yes    Pain Score  1     Pain Location  Knee    Pain Orientation  Right    Pain Descriptors / Indicators  Aching    Pain Type  Surgical pain;Acute pain    Pain Frequency  Constant    Multiple Pain Sites  No                       [facility] Adult PT Treatment/Exercise - 08/30/19 0001      Ambulation/Gait   Ambulation/Gait  Yes    Ambulation/Gait Assistance  5: Supervision    Ambulation/Gait Assistance Details  cues for proper sequencing and heel strike with SPC   Gait mechanics much improved after cueing with SPC   Ambulation Distance (Feet)  90 Feet    Assistive device  Straight cane    Gait Pattern  Decreased step length - right;Decreased step length - left;Decreased stance time - right;Decreased hip/knee flexion - right;Antalgic;Poor foot clearance - left;Poor foot clearance - right;Step-through pattern      Knee/Hip Exercises: Stretches   Passive Hamstring Stretch  Right;1 rep;30 seconds    Passive Hamstring Stretch Limitations  supine with strap       Knee/Hip Exercises: Aerobic   Nustep  Lvl 2, 6 min       Knee/Hip Exercises: Supine   Quad Sets  Right;10 reps;Strengthening    Quad Sets Limitations  5" hold - pillow under knee    Heel Slides  Right;10 reps;Strengthening    Heel Slides Limitations  with peanut p-ball    + strap   Straight Leg Raises  Right;10 reps;Strengthening    Straight Leg Raises Limitations  cues for quad set  Knee/Hip Exercises: Sidelying   Hip ABduction  Right;10 reps;Strengthening    Hip ABduction Limitations  from pillow btw knees      Modalities   Modalities  Vasopneumatic      Vasopneumatic   Number Minutes Vasopneumatic   10 minutes    Vasopnuematic Location   Knee   R   Vasopneumatic Pressure  Low    Vasopneumatic Temperature   coldest temp             PT Education - 08/30/19 1304    Education Details  HEP update; seated calf stretch, supine HS stretch    Person(s) Educated  Patient    Methods  Explanation;Demonstration;Verbal cues;Handout    Comprehension  Verbalized understanding;Returned demonstration;Verbal cues required       PT Short Term Goals - 08/30/19 0017      PT SHORT TERM GOAL #1   Title  Pt will be independent in his HEP.    Baseline  issued initial HEP today    Time  3    Period  Weeks    Status  On-going    Target Date  09/15/19      PT SHORT TERM GOAL #2   Title  Pt  will be able to perform active SLR with no extensor lag note and hold 10 seconds    Baseline  unable to lift LE off the mat table    Time  3    Period  Weeks    Status  On-going        PT Long Term Goals - 08/30/19 0809      PT LONG TERM GOAL #1   Title  Pt will be able to walk 1 mile with no assistive device with pain </= 2/10.    Baseline  walking only short distances with RW    Time  6    Period  Weeks    Status  On-going      PT LONG TERM GOAL #2   Title  Pt will be able to improve his R knee flexion to >/= 120 degrees in order to improve gait and functional mobillty.    Baseline  AROM: 20-75 degrees    Time  6    Period  Weeks    Status  On-going      PT LONG TERM GOAL #3   Title  Pt will improve his R LE strength in knee flexion and extension to >/= 4/5.    Baseline  extension: 2/5, flexion: 3/5    Time  6    Period  Weeks    Status  On-going      PT LONG TERM GOAL #4   Title  Pt will be independent in his HEP and progression.    Time  6    Period  Weeks    Status  On-going      PT LONG TERM GOAL #5   Title  Pt will be able to actively pick up object off the ground  from standing position deomonstrating correct body mechanics.    Time  6    Period  Weeks    Status  New            Plan - 08/30/19 0815    Clinical Impression Statement  Tyler [name] reporting that he is performing HEP daily.  Only minor cueing for quad set required with SLR.  Practice ambulating with [facility] today as pt. seen ambulating into clinic with no AD which he  self-weaned from without therapist encouragement.  Pt. quick to pick up on proper sequencing and gait mechanics with SPC with gait training.  Plans to purchase SPC.  Ended visit with HEP update.  Applied ice/compression to knee to reduce post-exercise swelling and pain.    Personal Factors and Comorbidities  Comorbidity 1    Comorbidities  past surgeries on L knee, OA, hypothyroidism    Rehab Potential  Excellent    PT  Treatment/Interventions  Cryotherapy;Electrical Stimulation;Gait training;Stair training;Balance training;Therapeutic exercise;Therapeutic activities;Functional mobility training;Neuromuscular re-education;Patient/family education;Manual techniques;Passive range of motion;Taping;Vasopneumatic Device    PT Next Visit Plan  Perform TUG or 5 time sit to stand at next visit. Bike, LE strengtheing, stretching, Vasopneumatic with elevation    PT Home Exercise Plan  Access Code: [email or web address] (quad sets, SLR, hip abd in sidelying,    Consulted and Agree with Plan of Care  Patient       Patient will benefit from skilled therapeutic intervention in order to improve the following deficits and impairments:  Pain, Postural dysfunction, Increased edema, Decreased strength, Impaired flexibility, Decreased range of motion, Difficulty walking, Decreased balance, Decreased mobility, Abnormal gait  Visit Diagnosis: Acute pain of right knee  Stiffness of right knee, not elsewhere classified  Difficulty in walking, not elsewhere classified  Localized edema     Problem List Patient Active Problem List   Diagnosis Date Noted  . Arthritis of right knee 08/17/2019    Kermit BaloMicah Joseth Weigel, PTA 08/30/19 6:02 PM   [facility] [address]  [address] [place], [place], [address] Phone: [phone]   Fax:  [phone]  Name: Tyler KetoStanley A [name] MRN: [id number] Date of Birth: [birth year]

## 2019-08-31 ENCOUNTER — Ambulatory Visit (INDEPENDENT_AMBULATORY_CARE_PROVIDER_SITE_OTHER): Payer: BC Managed Care – PPO

## 2019-08-31 ENCOUNTER — Ambulatory Visit (INDEPENDENT_AMBULATORY_CARE_PROVIDER_SITE_OTHER): Payer: BC Managed Care – PPO | Admitting: Family

## 2019-08-31 ENCOUNTER — Encounter: Payer: Self-pay | Admitting: Family

## 2019-08-31 VITALS — Ht 68.0 in | Wt 220.0 lb

## 2019-08-31 DIAGNOSIS — Z96651 Presence of right artificial knee joint: Secondary | ICD-10-CM | POA: Diagnosis not present

## 2019-08-31 MED ORDER — OXYCODONE-ACETAMINOPHEN 5-325 MG PO TABS: 1.0000 | ORAL_TABLET | Freq: Four times a day (QID) | ORAL | 0 refills | Status: DC | PRN

## 2019-08-31 NOTE — Progress Notes (Signed)
   Post-Op Visit Note   Patient: Tyler [name]           Date of Birth: Jan 28, 1966           MRN: [id number] Visit Date: 08/31/2019 PCP: Delilah Shan, MD  Chief Complaint: No chief complaint on file.   HPI:  HPI The patient is a 53 year old gentleman seen today 2 weeks status post right total knee arthroplasty he has been doing quite well with physical therapy advancing rapidly.  Today presents in regular shoewear without any assistive devices Ortho Exam Incision to the right knee is well-healed staples do remain in place there is no erythema very minimal swelling no sign of infection has flexion to 90 degrees lacks about 5 degrees of full extension  Visit Diagnoses:  1. S/P total knee arthroplasty, right     Plan: Discussed physical therapy.  Staples harvested without incident he will continue working on his physical therapy and home exercise program.  He will follow-up in 4 more weeks.  Follow-Up Instructions: No follow-ups on file.   Imaging: No results found.  Orders:  Orders Placed This Encounter  Procedures  . XR Knee 1-2 Views Right   No orders of the defined types were placed in this encounter.    PMFS History: Patient Active Problem List   Diagnosis Date Noted  . Arthritis of right knee 08/17/2019   Past Medical History:  Diagnosis Date  . Arthritis   . Hypothyroid   . Pneumonia     Family History  Problem Relation Age of Onset  . Heart attack Father     Past Surgical History:  Procedure Laterality Date  . HAND TENDON SURGERY Left   . QUADRICEPS TENDON REPAIR Left 06/13/2015   Procedure: REPAIR LEFT QUADRICEP TENDON;  Surgeon: Newt Minion, MD;  Location: [place];  Service: Orthopedics;  Laterality: Left;  . TOTAL KNEE ARTHROPLASTY Right 08/17/2019  . TOTAL KNEE ARTHROPLASTY Right 08/17/2019   Procedure: RIGHT TOTAL KNEE ARTHROPLASTY;  Surgeon: Newt Minion, MD;  Location: [place];  Service: Orthopedics;  Laterality: Right;   Social History    Occupational History  . Not on file  Tobacco Use  . Smoking status: Former Smoker    Quit date: 06/12/1995    Years since quitting: 24.2  . Smokeless tobacco: Never Used  Substance and Sexual Activity  . Alcohol use: Yes    Comment: 6 drinks/week  . Drug use: No  . Sexual activity: Not on file

## 2019-09-05 ENCOUNTER — Ambulatory Visit: Payer: BC Managed Care – PPO | Admitting: Physical Therapy

## 2019-09-09 ENCOUNTER — Other Ambulatory Visit: Payer: Self-pay

## 2019-09-09 ENCOUNTER — Ambulatory Visit: Payer: BC Managed Care – PPO | Attending: Orthopedic Surgery

## 2019-09-09 DIAGNOSIS — R600 Localized edema: Secondary | ICD-10-CM | POA: Insufficient documentation

## 2019-09-09 DIAGNOSIS — M25661 Stiffness of right knee, not elsewhere classified: Secondary | ICD-10-CM | POA: Diagnosis present

## 2019-09-09 DIAGNOSIS — M25561 Pain in right knee: Secondary | ICD-10-CM | POA: Insufficient documentation

## 2019-09-09 DIAGNOSIS — R262 Difficulty in walking, not elsewhere classified: Secondary | ICD-10-CM | POA: Diagnosis present

## 2019-09-09 NOTE — Therapy (Signed)
[facility] Outpatient Rehabilitation [facility] [address]  [address] [place], [place], [address] Phone: [phone]   Fax:  [phone]  Physical Therapy Treatment  Patient Details  Name: Tyler [name] MRN: [id number] Date of Birth: 02-15-1966 Referring Provider (PT): Aldean Baker, MD   Encounter Date: 09/09/2019  PT End of Session - 09/09/19 0816    Visit Number  3    Number of Visits  19    Date for PT Re-Evaluation  10/21/19    PT Start Time  0801    PT Stop Time  0855    PT Time Calculation (min)  54 min    Activity Tolerance  Patient tolerated treatment well;Patient limited by pain    Behavior During Therapy  [facility] for tasks assessed/performed       Past Medical History:  Diagnosis Date  . Arthritis   . Hypothyroid   . Pneumonia     Past Surgical History:  Procedure Laterality Date  . HAND TENDON SURGERY Left   . QUADRICEPS TENDON REPAIR Left 06/13/2015   Procedure: REPAIR LEFT QUADRICEP TENDON;  Surgeon: Nadara Mustard, MD;  Location: [facility] OR;  Service: Orthopedics;  Laterality: Left;  . TOTAL KNEE ARTHROPLASTY Right 08/17/2019  . TOTAL KNEE ARTHROPLASTY Right 08/17/2019   Procedure: RIGHT TOTAL KNEE ARTHROPLASTY;  Surgeon: Nadara Mustard, MD;  Location: [facility] OR;  Service: Orthopedics;  Laterality: Right;    There were no vitals filed for this visit.  Subjective Assessment - 09/09/19 0808    Subjective  Pt. reporting he feels his bend is coming along nicely.  Notes MD wsa pleased with his bend.    Patient Stated Goals  "I want to get back to hiking and play disc golf again without pain"    Currently in Pain?  No/denies    Pain Score  0-No pain    Multiple Pain Sites  No         [facility] PT Assessment - 09/09/19 0001      AROM   AROM Assessment Site  Knee    Right/Left Knee  Right    Right Knee Extension  14    Right Knee Flexion  104      PROM   PROM Assessment Site  Knee    Right/Left Knee  Right    Right Knee Extension  10     Right Knee Flexion  107                   [facility] Adult PT Treatment/Exercise - 09/09/19 0001      Ambulation/Gait   Ambulation/Gait  Yes    Ambulation/Gait Assistance  5: Supervision    Ambulation/Gait Assistance Details  cues for upright posture and TKE    Ambulation Distance (Feet)  90 Feet    Assistive device  Straight cane    Gait Pattern  Decreased step length - right;Decreased step length - left;Decreased stance time - right;Decreased hip/knee flexion - right;Antalgic;Poor foot clearance - left;Poor foot clearance - right;Step-through pattern      Self-Care   Self-Care  Scar Mobilizations;Other Self-Care Comments    Scar Mobilizations  Instruction in self scar massage     Other Self-Care Comments   verbally instructed in proper patellar mobs for improved R knee ROM      Knee/Hip Exercises: Stretches   Passive Hamstring Stretch  Right;2 reps;30 seconds    Passive Hamstring Stretch Limitations  seated with heel prop on stool  Gastroc Stretch  Right;2 reps;30 seconds    Gastroc Stretch Limitations  runners stretch into wall     Other Knee/Hip Stretches  R knee extension stretch with heel prop x 1 min       Knee/Hip Exercises: Aerobic   Recumbent Bike  lvl 1 - partial and full revolutions       Knee/Hip Exercises: Supine   Straight Leg Raises  Right;15 reps;Strengthening    Straight Leg Raises Limitations  cues for quad set       Vasopneumatic   Number Minutes Vasopneumatic   10 minutes    Vasopnuematic Location   Knee   R   Vasopneumatic Pressure  Low    Vasopneumatic Temperature   coldest temp      Manual Therapy   Manual Therapy  Soft tissue mobilization    Manual therapy comments  seated     Soft tissue mobilization  STM scar massage to R superior incision avoiding lower incision which is not yet healed              PT Education - 09/09/19 0852    Education Details  HEP update;  seated HS stretch,  standing calf runners stretch, seated knee  extension stretch, standing TKE with blue TB  issued to pt.,  seated quad set    Person(s) Educated  Patient    Methods  Explanation;Demonstration;Verbal cues;Handout    Comprehension  Verbalized understanding;Returned demonstration;Verbal cues required       PT Short Term Goals - 08/30/19 16100808      PT SHORT TERM GOAL #1   Title  Pt will be independent in his HEP.    Baseline  issued initial HEP today    Time  3    Period  Weeks    Status  On-going    Target Date  09/15/19      PT SHORT TERM GOAL #2   Title  Pt will be able to perform active SLR with no extensor lag note and hold 10 seconds    Baseline  unable to lift LE off the mat table    Time  3    Period  Weeks    Status  On-going        PT Long Term Goals - 08/30/19 0809      PT LONG TERM GOAL #1   Title  Pt will be able to walk 1 mile with no assistive device with pain </= 2/10.    Baseline  walking only short distances with RW    Time  6    Period  Weeks    Status  On-going      PT LONG TERM GOAL #2   Title  Pt will be able to improve his R knee flexion to >/= 120 degrees in order to improve gait and functional mobillty.    Baseline  AROM: 20-75 degrees    Time  6    Period  Weeks    Status  On-going      PT LONG TERM GOAL #3   Title  Pt will improve his R LE strength in knee flexion and extension to >/= 4/5.    Baseline  extension: 2/5, flexion: 3/5    Time  6    Period  Weeks    Status  On-going      PT LONG TERM GOAL #4   Title  Pt will be independent in his HEP and progression.    Time  6  Period  Weeks    Status  On-going      PT LONG TERM GOAL #5   Title  Pt will be able to actively pick up object off the ground  from standing position deomonstrating correct body mechanics.    Time  6    Period  Weeks    Status  New            Plan - 09/09/19 0857    Clinical Impression Statement  Jettie doing well.  Ambulating into session with SPC with somewhat improved gait mechanics and  improved hip/knee flexion however did work with pt. today with gait training for improved heel strike which is primary limited still by poor extension PROM/AROM.  Session focused on improvement of extension ROM with passive stretching, instruction in prolonged heel-prop extension stretch at home, and active standing TKE, quad setting.  Pt. ambulating near end of session with somewhat improved TKE and verbalized awareness of what he needs to do with updated HEP at home to improve extension ROM.  Ended session with ice/compression to R knee to reduce post-exercise swelling and soreness.    Comorbidities  past surgeries on L knee, OA, hypothyroidism    Rehab Potential  Excellent    PT Treatment/Interventions  Cryotherapy;Electrical Stimulation;Gait training;Stair training;Balance training;Therapeutic exercise;Therapeutic activities;Functional mobility training;Neuromuscular re-education;Patient/family education;Manual techniques;Passive range of motion;Taping;Vasopneumatic Device    PT Next Visit Plan  Instruction in patellar mobs; Perform TUG or 5 time sit to stand; LE strengtheing, stretching, Vasopneumatic with elevation    PT Home Exercise Plan  Access Code: [email or web address] (quad sets, SLR, hip abd in sidelying,    Consulted and Agree with Plan of Care  Patient       Patient will benefit from skilled therapeutic intervention in order to improve the following deficits and impairments:  Pain, Postural dysfunction, Increased edema, Decreased strength, Impaired flexibility, Decreased range of motion, Difficulty walking, Decreased balance, Decreased mobility, Abnormal gait  Visit Diagnosis: Acute pain of right knee  Stiffness of right knee, not elsewhere classified  Difficulty in walking, not elsewhere classified  Localized edema     Problem List Patient Active Problem List   Diagnosis Date Noted  . Arthritis of right knee 08/17/2019    Bess Harvest, PTA 09/09/19 12:42 PM   [facility] [place] [address]  [place], [place], [address] Phone: [phone]   Fax:  [phone]  Name: KERWIN AUGUSTUS MRN: [id number] Date of Birth: May 28, 1966

## 2019-09-12 ENCOUNTER — Encounter: Payer: Self-pay | Admitting: Physical Therapy

## 2019-09-12 ENCOUNTER — Other Ambulatory Visit: Payer: Self-pay

## 2019-09-12 ENCOUNTER — Ambulatory Visit: Payer: BC Managed Care – PPO | Admitting: Physical Therapy

## 2019-09-12 DIAGNOSIS — M25561 Pain in right knee: Secondary | ICD-10-CM

## 2019-09-12 DIAGNOSIS — R600 Localized edema: Secondary | ICD-10-CM

## 2019-09-12 DIAGNOSIS — R262 Difficulty in walking, not elsewhere classified: Secondary | ICD-10-CM

## 2019-09-12 DIAGNOSIS — M25661 Stiffness of right knee, not elsewhere classified: Secondary | ICD-10-CM

## 2019-09-12 NOTE — Therapy (Signed)
[facility] [address]  [address] [place], [place], [address] Phone: [phone]   Fax:  [phone]  Physical Therapy Treatment  Patient Details  Name: Tyler KetoStanley A Melrose MRN: [id number] Date of Birth: Sep 25, 1966 Referring Provider (PT): Aldean BakerMarcus Duda, MD   Encounter Date: 09/12/2019  PT End of Session - 09/12/19 0843    Visit Number  4    Number of Visits  19    Date for PT Re-Evaluation  10/21/19    PT Start Time  0804    PT Stop Time  0850    PT Time Calculation (min)  46 min    Activity Tolerance  Patient tolerated treatment well    Behavior During Therapy  [facility] for tasks assessed/performed       Past Medical History:  Diagnosis Date  . Arthritis   . Hypothyroid   . Pneumonia     Past Surgical History:  Procedure Laterality Date  . HAND TENDON SURGERY Left   . QUADRICEPS TENDON REPAIR Left 06/13/2015   Procedure: REPAIR LEFT QUADRICEP TENDON;  Surgeon: Nadara MustardMarcus Duda V, MD;  Location: [facility] OR;  Service: Orthopedics;  Laterality: Left;  . TOTAL KNEE ARTHROPLASTY Right 08/17/2019  . TOTAL KNEE ARTHROPLASTY Right 08/17/2019   Procedure: RIGHT TOTAL KNEE ARTHROPLASTY;  Surgeon: Nadara Mustarduda, Marcus V, MD;  [facility];  Service: Orthopedics;  Laterality: Right;    There were no vitals filed for this visit.  Subjective Assessment - 09/12/19 0805    Subjective  Feeling good since last session.    Patient Stated Goals  "I want to get back to hiking and play disc golf again without pain"    Currently in Pain?  No/denies                       [facility]Ex[facility] - 09/12/19 0001      Knee/Hip Exercises: Stretches   Passive Hamstring Stretch  Right;2 reps;30 seconds    Passive Hamstring Stretch Limitations  supine with strap    Hip Flexor Stretch  Right;2 reps;30 seconds    Hip Flexor Stretch Limitations  mod thomas with strap    ITB Stretch  Right;20 seconds;2 reps    ITB Stretch  Limitations  supine with strap   cues to maintain hips down on mat     Knee/Hip Exercises: Aerobic   Nustep  Lvl 2, 6 min (UEs/LEs)      Knee/Hip Exercises: Standing   Terminal Knee Extension  Strengthening;Right;1 set;10 reps;Theraband    Theraband Level (Terminal Knee Extension)  Level 4 (Blue)    Terminal Knee Extension Limitations  10x3" with 1 UE support on chair   cues for positioning and quad contraction     Knee/Hip Exercises: Seated   Long Arc Quad  AROM;Right;1 set;10 reps    Long [employer] Quad Limitations  lacking TKE   cues for slow speed and TKE     Knee/Hip Exercises: Supine   Quad Sets  Strengthening;Right;5 reps    Quad Sets Limitations  5x10" with 1/2 bolster under knee    Heel Slides  AAROM;Right;1 set;10 reps    Heel Slides Limitations  10x3" with strap and orange pball    Straight Leg Raises  Right;Strengthening;10 reps    Straight Leg Raises Limitations  cues for quad set before each rep      Vasopneumatic   Number Minutes Vasopneumatic   10 minutes    Vasopnuematic Location  Knee   R   Vasopneumatic Pressure  Medium    Vasopneumatic Temperature   coldest temp      Manual Therapy   Manual Therapy  Joint mobilization    Manual therapy comments  supine    Joint Mobilization  R patellar mobilizations grade III to tolerance in all directions    mild-mod hypomobility with medial and superoinferior glides            PT Education - 09/12/19 0842    Education Details  update to HEP    Person(s) Educated  Patient    Methods  Explanation;Demonstration;Tactile cues;Verbal cues;Handout    Comprehension  Verbalized understanding;Returned demonstration       PT Short Term Goals - 08/30/19 2229      PT SHORT TERM GOAL #1   Title  Pt will be independent in his HEP.    Baseline  issued initial HEP today    Time  3    Period  Weeks    Status  On-going    Target Date  09/15/19      PT SHORT TERM GOAL #2   Title  Pt will be able to perform active SLR with  no extensor lag note and hold 10 seconds    Baseline  unable to lift LE off the mat table    Time  3    Period  Weeks    Status  On-going        PT Long Term Goals - 08/30/19 0809      PT LONG TERM GOAL #1   Title  Pt will be able to walk 1 mile with no assistive device with pain </= 2/10.    Baseline  walking only short distances with RW    Time  6    Period  Weeks    Status  On-going      PT LONG TERM GOAL #2   Title  Pt will be able to improve his R knee flexion to >/= 120 degrees in order to improve gait and functional mobillty.    Baseline  AROM: 20-75 degrees    Time  6    Period  Weeks    Status  On-going      PT LONG TERM GOAL #3   Title  Pt will improve his R LE strength in knee flexion and extension to >/= 4/5.    Baseline  extension: 2/5, flexion: 3/5    Time  6    Period  Weeks    Status  On-going      PT LONG TERM GOAL #4   Title  Pt will be independent in his HEP and progression.    Time  6    Period  Weeks    Status  On-going      PT LONG TERM GOAL #5   Title  Pt will be able to actively pick up object off the ground  from standing position deomonstrating correct body mechanics.    Time  6    Period  Weeks    Status  New            Plan - 09/12/19 0843    Clinical Impression Statement  atient reporting no complaints at beginning of session. Assessed patellar mobility with joint mobs- patient demonstrating mild-mod hypomobility with medial and superoinferior patellar glides. Thus, educated patient on self- mobs for improvement in joint mobility. Still demonstrating evident R HS tightness with stretching today. Did report tenderness over  R ITB with palpation, thus addressed this with TFL stretch and added this exercise into HEP. Worked on quad strengthening and knee extension with remaining ther-ex. Patient tolerated all exercises well. Ended session with [employer] to R knee for post-exercise pain. Patient without complaints at end of session.     Comorbidities  past surgeries on L knee, OA, hypothyroidism    Rehab Potential  Excellent    PT Treatment/Interventions  Cryotherapy;Electrical Stimulation;Gait training;Stair training;Balance training;Therapeutic exercise;Therapeutic activities;Functional mobility training;Neuromuscular re-education;Patient/family education;Manual techniques;Passive range of motion;Taping;Vasopneumatic Device    PT Next Visit Plan  Perform TUG or 5 time sit to stand; LE strengtheing, stretching, Vasopneumatic with elevation    PT Home Exercise Plan  Access Code: [email or web address] (quad sets, SLR, hip abd in sidelying,    Consulted and Agree with Plan of Care  Patient       Patient will benefit from skilled therapeutic intervention in order to improve the following deficits and impairments:  Pain, Postural dysfunction, Increased edema, Decreased strength, Impaired flexibility, Decreased range of motion, Difficulty walking, Decreased balance, Decreased mobility, Abnormal gait  Visit Diagnosis: Acute pain of right knee  Stiffness of right knee, not elsewhere classified  Difficulty in walking, not elsewhere classified  Localized edema     Problem List Patient Active Problem List   Diagnosis Date Noted  . Arthritis of right knee 08/17/2019     Anette Guarneri, PT, DPT 09/12/19 8:51 AM   [facility] [address]  [address] [place], [place], [address] Phone: [phone]   Fax:  [phone]  Name: AINSLEY SANGUINETTI MRN: [id number] Date of Birth: 1966-06-01

## 2019-09-14 ENCOUNTER — Ambulatory Visit: Payer: BC Managed Care – PPO

## 2019-09-14 ENCOUNTER — Other Ambulatory Visit: Payer: Self-pay

## 2019-09-14 DIAGNOSIS — R600 Localized edema: Secondary | ICD-10-CM

## 2019-09-14 DIAGNOSIS — M25661 Stiffness of right knee, not elsewhere classified: Secondary | ICD-10-CM

## 2019-09-14 DIAGNOSIS — M25561 Pain in right knee: Secondary | ICD-10-CM | POA: Diagnosis not present

## 2019-09-14 DIAGNOSIS — R262 Difficulty in walking, not elsewhere classified: Secondary | ICD-10-CM

## 2019-09-14 NOTE — Therapy (Addendum)
[facility] Outpatient Rehabilitation [facility] [address]  [address] [place], [place], [address] Phone: [phone]   Fax:  [phone]  Physical Therapy Treatment  Patient Details  Name: Tyler [name] MRN: [id number] Date of Birth: [birth year] Referring Provider (PT): Aldean Baker, MD   Encounter Date: 09/14/2019  PT End of Session - 09/14/19 0806    Visit Number  5    Number of Visits  19    Date for PT Re-Evaluation  10/21/19    PT Start Time  0800    PT Stop Time  0858    PT Time Calculation (min)  58 min    Activity Tolerance  Patient tolerated treatment well    Behavior During Therapy  [facility] for tasks assessed/performed       Past Medical History:  Diagnosis Date  . Arthritis   . Hypothyroid   . Pneumonia     Past Surgical History:  Procedure Laterality Date  . HAND TENDON SURGERY Left   . QUADRICEPS TENDON REPAIR Left 06/13/2015   Procedure: REPAIR LEFT QUADRICEP TENDON;  Surgeon: Nadara Mustard, MD;  Location: [facility] OR;  Service: Orthopedics;  Laterality: Left;  . TOTAL KNEE ARTHROPLASTY Right 08/17/2019  . TOTAL KNEE ARTHROPLASTY Right 08/17/2019   Procedure: RIGHT TOTAL KNEE ARTHROPLASTY;  Surgeon: Nadara Mustard, MD;  Location: [facility] OR;  Service: Orthopedics;  Laterality: Right;    There were no vitals filed for this visit.  Subjective Assessment - 09/14/19 0811    Subjective  Denies pain after last session.    Patient Stated Goals  "I want to get back to hiking and play disc golf again without pain"    Currently in Pain?  No/denies    Pain Score  0-No pain   up to 4/10 at worst   Pain Location  Knee                       [facility] Adult PT Treatment/Exercise - 09/14/19 0001      Knee/Hip Exercises: Stretches   Passive Hamstring Stretch  Right;2 reps;30 seconds    Passive Hamstring Stretch Limitations  supine with strap    Quad Stretch  Right;1 rep;30 seconds    Quad Stretch Limitations  prone with strap     Hip  Flexor Stretch  Right;2 reps;30 seconds    Hip Flexor Stretch Limitations  mod thomas with strap    ITB Stretch  Right;1 rep;30 seconds    ITB Stretch Limitations  supine with strap     Gastroc Stretch  Right;2 reps;30 seconds    Gastroc Stretch Limitations  runners stretch into wall     Other Knee/Hip Stretches  R knee extension stretch with heel prop x 1 min    good technique    Other Knee/Hip Stretches  R prostretch x 30 sec       Knee/Hip Exercises: Aerobic   Recumbent Bike  lvl 1, 6 min       Knee/Hip Exercises: Machines for Strengthening   Cybex Knee Extension  B LEs: 10# x 10 reps     Cybex Knee Flexion  B LEs: 20# x 10 reps     Cybex Leg Press  B LEs; 20# x 15 reps       Knee/Hip Exercises: Standing   Forward Step Up  Right;10 reps;Hand Hold: 1;Step Height: 6"    Forward Step Up Limitations  at [employer]  10 reps;3 seconds    Functional Squat Limitations  counter squat       Manual Therapy   Manual Therapy  Joint mobilization    Manual therapy comments  supine    Joint Mobilization  R patellar mobilizations grade III to tolerance       Vasoneumatic compression:  R knee in supine; coldest temp, 10', medium compression        PT Education - 09/14/19 0847    Education Details  HEP update;  prone quad stretch, hip flexor stretch    Person(s) Educated  Patient    Methods  Explanation;Demonstration;Verbal cues;Handout    Comprehension  Verbalized understanding;Returned demonstration;Verbal cues required       PT Short Term Goals - 09/14/19 0848      PT SHORT TERM GOAL #1   Title  Pt will be independent in his HEP.    Baseline  issued initial HEP today    Time  3    Period  Weeks    Status  Achieved    Target Date  09/15/19      PT SHORT TERM GOAL #2   Title  Pt will be able to perform active SLR with no extensor lag note and hold 10 seconds    Baseline  unable to lift LE off the mat table    Time  3    Period  Weeks    Status   On-going        PT Long Term Goals - 08/30/19 0809      PT LONG TERM GOAL #1   Title  Pt will be able to walk 1 mile with no assistive device with pain </= 2/10.    Baseline  walking only short distances with RW    Time  6    Period  Weeks    Status  On-going      PT LONG TERM GOAL #2   Title  Pt will be able to improve his R knee flexion to >/= 120 degrees in order to improve gait and functional mobillty.    Baseline  AROM: 20-75 degrees    Time  6    Period  Weeks    Status  On-going      PT LONG TERM GOAL #3   Title  Pt will improve his R LE strength in knee flexion and extension to >/= 4/5.    Baseline  extension: 2/5, flexion: 3/5    Time  6    Period  Weeks    Status  On-going      PT LONG TERM GOAL #4   Title  Pt will be independent in his HEP and progression.    Time  6    Period  Weeks    Status  On-going      PT LONG TERM GOAL #5   Title  Pt will be able to actively pick up object off the ground  from standing position deomonstrating correct body mechanics.    Time  6    Period  Weeks    Status  New            Plan - 09/14/19 [id number]    Clinical Impression Statement  Pt. reporting he is performing HEP daily x 2.  STG #1 achieved.  Tolerated initiation of LE strengthening on machines, forward step-ups and squatting well.  Did note mild increased in R knee pain to end session thus applied ice/compression to R knee to reduce  post-exercise pain and swelling.  Pt. encouraged to continue working on extension/flexion ROM activities as home and continue with seated heel prop extension stretch for improved ext. ROM.  Pt. verbalized understanding.    Comorbidities  past surgeries on L knee, OA, hypothyroidism    Rehab Potential  Excellent    PT Treatment/Interventions  Cryotherapy;Electrical Stimulation;Gait training;Stair training;Balance training;Therapeutic exercise;Therapeutic activities;Functional mobility training;Neuromuscular re-education;Patient/family  education;Manual techniques;Passive range of motion;Taping;Vasopneumatic Device    PT Home Exercise Plan  Access Code: [email or web address] (quad sets, SLR, hip abd in sidelying,    Consulted and Agree with Plan of Care  Patient       Patient will benefit from skilled therapeutic intervention in order to improve the following deficits and impairments:  Pain, Postural dysfunction, Increased edema, Decreased strength, Impaired flexibility, Decreased range of motion, Difficulty walking, Decreased balance, Decreased mobility, Abnormal gait  Visit Diagnosis: Acute pain of right knee  Stiffness of right knee, not elsewhere classified  Difficulty in walking, not elsewhere classified  Localized edema     Problem List Patient Active Problem List   Diagnosis Date Noted  . Arthritis of right knee 08/17/2019    Bess Harvest, PTA 09/14/19 9:23 AM   [facility] [address]  [place], [place], [address] Phone: [phone]   Fax:  [phone]  Name: Tyler [name] MRN: [id number] Date of Birth: August 19, 1966

## 2019-09-16 ENCOUNTER — Ambulatory Visit: Payer: BC Managed Care – PPO

## 2019-09-16 ENCOUNTER — Other Ambulatory Visit: Payer: Self-pay

## 2019-09-16 DIAGNOSIS — R600 Localized edema: Secondary | ICD-10-CM

## 2019-09-16 DIAGNOSIS — M25561 Pain in right knee: Secondary | ICD-10-CM | POA: Diagnosis not present

## 2019-09-16 DIAGNOSIS — R262 Difficulty in walking, not elsewhere classified: Secondary | ICD-10-CM

## 2019-09-16 DIAGNOSIS — M25661 Stiffness of right knee, not elsewhere classified: Secondary | ICD-10-CM

## 2019-09-16 NOTE — Therapy (Signed)
[facility] [address]  [address] [place], [place], [address] Phone: [phone]   Fax:  [phone]  Physical Therapy Treatment  Patient Details  Name: Tyler [name] MRN: [id number] Date of Birth: 12-03-1965 Referring Provider (PT): Aldean Baker, MD   Encounter Date: 09/16/2019  PT End of Session - 09/16/19 0807    Visit Number  6    Number of Visits  19    Date for PT Re-Evaluation  10/21/19    PT Start Time  0802    PT Stop Time  0855    PT Time Calculation (min)  53 min    Activity Tolerance  Patient tolerated treatment well    Behavior During Therapy  [facility] for tasks assessed/performed       Past Medical History:  Diagnosis Date  . Arthritis   . Hypothyroid   . Pneumonia     Past Surgical History:  Procedure Laterality Date  . HAND TENDON SURGERY Left   . QUADRICEPS TENDON REPAIR Left 06/13/2015   Procedure: REPAIR LEFT QUADRICEP TENDON;  Surgeon: Nadara Mustard, MD;  Location: [facility] OR;  Service: Orthopedics;  Laterality: Left;  . TOTAL KNEE ARTHROPLASTY Right 08/17/2019  . TOTAL KNEE ARTHROPLASTY Right 08/17/2019   Procedure: RIGHT TOTAL KNEE ARTHROPLASTY;  Surgeon: Nadara Mustard, MD;  Location: [facility] OR;  Service: Orthopedics;  Laterality: Right;    There were no vitals filed for this visit.  Subjective Assessment - 09/16/19 0806    Subjective  Notes he has been using moist heat on back of knee and ice on front of knee.  Feels this is helping relax his muscles.    Patient Stated Goals  "I want to get back to hiking and play disc golf again without pain"    Currently in Pain?  No/denies    Pain Score  0-No pain    Multiple Pain Sites  No         [facility] PT Assessment - 09/16/19 0001      AROM   AROM Assessment Site  Knee    Right/Left Knee  Right    Right Knee Extension  9    Right Knee Flexion  110      PROM   PROM Assessment Site  Knee    Right/Left Knee  Right    Right Knee Extension  7    Right Knee Flexion  112                   [facility] Adult PT Treatment/Exercise - 09/16/19 0001      Self-Care   Self-Care  Other Self-Care Comments    Other Self-Care Comments   review of self-patellar mobs; pt. able to demo good technique       Knee/Hip Exercises: Stretches   Lobbyist  Right;1 rep;30 seconds    Lobbyist Limitations  prone with strap     Hip Flexor Stretch  Right;2 reps;30 seconds    Hip Flexor Stretch Limitations  mod thomas with strap      Knee/Hip Exercises: Aerobic   Elliptical  lvl 1.0, 3 min    Nustep  Lvl 3, 3 min (UEs/LEs)      Knee/Hip Exercises: Standing   Heel Raises  Both;15 reps    Heel Raises Limitations  at TM rail     Hip Flexion  Right;Left;10 reps;Knee straight;Stengthening    Hip Flexion Limitations  at TM rail    Hip  Abduction  Right;Left;10 reps;Knee straight;Stengthening    Abduction Limitations  at TM rail     Hip Extension  Right;Left    Functional Squat  15 reps;3 seconds    Functional Squat Limitations  Triple extension at TM rail       Knee/Hip Exercises: Supine   Quad Sets  Right;10 reps;Strengthening    Quad Sets Limitations  5" hold + overpressure from therapist heel on foam       Vasopneumatic   Number Minutes Vasopneumatic   10 minutes    Vasopnuematic Location   Knee   R   Vasopneumatic Pressure  Medium    Vasopneumatic Temperature   coldest temp      Manual Therapy   Joint Mobilization  R knee A/P mobs grade III for improved flexion/ext. ROM                PT Short Term Goals - 09/14/19 0848      PT SHORT TERM GOAL #1   Title  Pt will be independent in his HEP.    Baseline  issued initial HEP today    Time  3    Period  Weeks    Status  Achieved    Target Date  09/15/19      PT SHORT TERM GOAL #2   Title  Pt will be able to perform active SLR with no extensor lag note and hold 10 seconds    Baseline  unable to lift LE off the mat table    Time  3    Period  Weeks    Status   On-going        PT Long Term Goals - 08/30/19 0809      PT LONG TERM GOAL #1   Title  Pt will be able to walk 1 mile with no assistive device with pain </= 2/10.    Baseline  walking only short distances with RW    Time  6    Period  Weeks    Status  On-going      PT LONG TERM GOAL #2   Title  Pt will be able to improve his R knee flexion to >/= 120 degrees in order to improve gait and functional mobillty.    Baseline  AROM: 20-75 degrees    Time  6    Period  Weeks    Status  On-going      PT LONG TERM GOAL #3   Title  Pt will improve his R LE strength in knee flexion and extension to >/= 4/5.    Baseline  extension: 2/5, flexion: 3/5    Time  6    Period  Weeks    Status  On-going      PT LONG TERM GOAL #4   Title  Pt will be independent in his HEP and progression.    Time  6    Period  Weeks    Status  On-going      PT LONG TERM GOAL #5   Title  Pt will be able to actively pick up object off the ground  from standing position deomonstrating correct body mechanics.    Time  6    Period  Weeks    Status  New            Plan - 09/16/19 2725    Clinical Impression Statement  Harve able to demo improved R knee PROM extension to 7 dg, AROM ext. to  9 dg today.  Notes he has been performing his extension-based ROM HEP program 2x/day. Updated HEP with squat/"triple extension" and other LE strengthening today which pt. was able to perform with good technique. Ended visit with 10 min ice/compression to knee to reduce post-exercise swelling and pain.    Comorbidities  past surgeries on L knee, OA, hypothyroidism    Rehab Potential  Excellent    PT Home Exercise Plan  Access Code: [email or web address] (quad sets, SLR, hip abd in sidelying,    Consulted and Agree with Plan of Care  Patient       Patient will benefit from skilled therapeutic intervention in order to improve the following deficits and impairments:  Pain, Postural dysfunction, Increased edema, Decreased strength,  Impaired flexibility, Decreased range of motion, Difficulty walking, Decreased balance, Decreased mobility, Abnormal gait  Visit Diagnosis: Acute pain of right knee  Stiffness of right knee, not elsewhere classified  Difficulty in walking, not elsewhere classified  Localized edema     Problem List Patient Active Problem List   Diagnosis Date Noted  . Arthritis of right knee 08/17/2019    Kermit BaloMicah Durward Matranga, PTA 09/16/19 10:12 AM   [facility] [address]  [address] [place], [place], [address] Phone: [phone]   Fax:  [phone]  Name: Eveline KetoStanley A Uncapher MRN: [id number] Date of Birth: August 08, 1966

## 2019-09-19 ENCOUNTER — Encounter: Payer: Self-pay | Admitting: Physical Therapy

## 2019-09-19 ENCOUNTER — Ambulatory Visit: Payer: BC Managed Care – PPO | Admitting: Physical Therapy

## 2019-09-19 ENCOUNTER — Other Ambulatory Visit: Payer: Self-pay

## 2019-09-19 DIAGNOSIS — M25561 Pain in right knee: Secondary | ICD-10-CM

## 2019-09-19 DIAGNOSIS — R262 Difficulty in walking, not elsewhere classified: Secondary | ICD-10-CM

## 2019-09-19 DIAGNOSIS — R600 Localized edema: Secondary | ICD-10-CM

## 2019-09-19 DIAGNOSIS — M25661 Stiffness of right knee, not elsewhere classified: Secondary | ICD-10-CM

## 2019-09-19 NOTE — Therapy (Signed)
[facility] [address]  [place], [place], [address] Phone: [phone]   Fax:  [phone]  Physical Therapy Treatment  Patient Details  Name: Tyler [name] MRN: [id number] Date of Birth: [birth year] Referring Provider (PT): Meridee Score, MD   Encounter Date: 09/19/2019  PT End of Session - 09/19/19 0847    Visit Number  7    Number of Visits  19    Date for PT Re-Evaluation  10/21/19    PT Start Time  0802    PT Stop Time  8921    PT Time Calculation (min)  52 min    Activity Tolerance  Patient tolerated treatment well    Behavior During Therapy  [facility] for tasks assessed/performed       Past Medical History:  Diagnosis Date  . Arthritis   . Hypothyroid   . Pneumonia     Past Surgical History:  Procedure Laterality Date  . HAND TENDON SURGERY Left   . QUADRICEPS TENDON REPAIR Left 06/13/2015   Procedure: REPAIR LEFT QUADRICEP TENDON;  Surgeon: Newt Minion, MD;  Location: [place];  Service: Orthopedics;  Laterality: Left;  . TOTAL KNEE ARTHROPLASTY Right 08/17/2019  . TOTAL KNEE ARTHROPLASTY Right 08/17/2019   Procedure: RIGHT TOTAL KNEE ARTHROPLASTY;  Surgeon: Newt Minion, MD;  Location: [place];  Service: Orthopedics;  Laterality: Right;    There were no vitals filed for this visit.  Subjective Assessment - 09/19/19 0802    Subjective  Doing well so far today.    Patient Stated Goals  "I want to get back to hiking and play disc golf again without pain"    Currently in Pain?  No/denies                       [facility] Adult PT Treatment/Exercise - 09/19/19 0001      Self-Care   Self-Care  Other Self-Care Comments    Other Self-Care Comments   edu and demonstration of foam roll to R distal HS for tension and pain relief      Knee/Hip Exercises: Stretches   Passive Hamstring Stretch  Right;2 reps   2x45"   Passive Hamstring Stretch Limitations  supine with strap      Knee/Hip  Exercises: Aerobic   Recumbent Bike  lvl 2, 6 min       Knee/Hip Exercises: Standing   Forward Step Up  Right;10 reps;Hand Hold: 1;Step Height: 6";2 sets;Step Height: 4"    Forward Step Up Limitations  at TM rail    step up R/through with L; heavy cues for upright trunk     Knee/Hip Exercises: Supine   Quad Sets  Right;10 reps;Strengthening    Quad Sets Limitations  10x10" with heels on 1/2 bolster      Vasopneumatic   Number Minutes Vasopneumatic   10 minutes    Vasopnuematic Location   Knee   R   Vasopneumatic Pressure  Medium    Vasopneumatic Temperature   coldest temp      Manual Therapy   Manual Therapy  Joint mobilization;Soft tissue mobilization;Myofascial release    Manual therapy comments  supine    Joint Mobilization  R patellar mobs in all directions grade III to tolerance; R knee extension mobs grade III with 1/2 bolster under heels    Soft tissue mobilization  STM and IASTM to R medial and lateral distal HS- tenderness and soft tissue restriction  Myofascial Release  TPR to R medial and lateral HS- tender trigger pts evident throughout             PT Education - 09/19/19 0847    Education Details  update to HEP    Person(s) Educated  Patient    Methods  Explanation;Demonstration;Tactile cues;Verbal cues;Handout    Comprehension  Verbalized understanding;Returned demonstration       PT Short Term Goals - 09/14/19 0848      PT SHORT TERM GOAL #1   Title  Pt will be independent in his HEP.    Baseline  issued initial HEP today    Time  3    Period  Weeks    Status  Achieved    Target Date  09/15/19      PT SHORT TERM GOAL #2   Title  Pt will be able to perform active SLR with no extensor lag note and hold 10 seconds    Baseline  unable to lift LE off the mat table    Time  3    Period  Weeks    Status  On-going        PT Long Term Goals - 08/30/19 0809      PT LONG TERM GOAL #1   Title  Pt will be able to walk 1 mile with no assistive  device with pain </= 2/10.    Baseline  walking only short distances with RW    Time  6    Period  Weeks    Status  On-going      PT LONG TERM GOAL #2   Title  Pt will be able to improve his R knee flexion to >/= 120 degrees in order to improve gait and functional mobillty.    Baseline  AROM: 20-75 degrees    Time  6    Period  Weeks    Status  On-going      PT LONG TERM GOAL #3   Title  Pt will improve his R LE strength in knee flexion and extension to >/= 4/5.    Baseline  extension: 2/5, flexion: 3/5    Time  6    Period  Weeks    Status  On-going      PT LONG TERM GOAL #4   Title  Pt will be independent in his HEP and progression.    Time  6    Period  Weeks    Status  On-going      PT LONG TERM GOAL #5   Title  Pt will be able to actively pick up object off the ground  from standing position deomonstrating correct body mechanics.    Time  6    Period  Weeks    Status  New            Plan - 09/19/19 0847    Clinical Impression Statement  Patient without new complaints this AM. Worked on joint mobility with patellar and knee extension mobs to patient's tolerance. Patient demonstrated good overall patellar mobility and tibiofemoral mobility, but still with evident restriction in knee extension ROM. Addressed hamstring tightness with STM, IASTM, and TPR to lateral and medial hamstrings- patient reported relief after manual therapy. Continued with stair training with cues to maintain upright posture and encourage knee flexion with heel touch. Patient demonstrated good effort to correct according to cues. Ended session with [employer] to R knee for pain relief. No complaints at end of session.  Comorbidities  past surgeries on L knee, OA, hypothyroidism    Rehab Potential  Excellent    PT Next Visit Plan  R HS STM and stretching, stair training    PT Home Exercise Plan  Access Code: [email or web address] (quad sets, SLR, hip abd in sidelying,    Consulted and Agree with Plan of Care   Patient       Patient will benefit from skilled therapeutic intervention in order to improve the following deficits and impairments:  Pain, Postural dysfunction, Increased edema, Decreased strength, Impaired flexibility, Decreased range of motion, Difficulty walking, Decreased balance, Decreased mobility, Abnormal gait  Visit Diagnosis: Acute pain of right knee  Stiffness of right knee, not elsewhere classified  Difficulty in walking, not elsewhere classified  Localized edema     Problem List Patient Active Problem List   Diagnosis Date Noted  . Arthritis of right knee 08/17/2019     Anette GuarneriYevgeniya Cheyne Bungert, PT, DPT 09/19/19 8:59 AM   [facility] [address]  [address] [place], [place], [address] Phone: [phone]   Fax:  [phone]  Name: Tyler [name] MRN: [id number] Date of Birth: 08-17-1966

## 2019-09-21 ENCOUNTER — Ambulatory Visit: Payer: BC Managed Care – PPO

## 2019-09-21 ENCOUNTER — Other Ambulatory Visit: Payer: Self-pay

## 2019-09-21 DIAGNOSIS — R600 Localized edema: Secondary | ICD-10-CM

## 2019-09-21 DIAGNOSIS — M25561 Pain in right knee: Secondary | ICD-10-CM

## 2019-09-21 DIAGNOSIS — R262 Difficulty in walking, not elsewhere classified: Secondary | ICD-10-CM

## 2019-09-21 DIAGNOSIS — M25661 Stiffness of right knee, not elsewhere classified: Secondary | ICD-10-CM

## 2019-09-21 NOTE — Therapy (Signed)
[facility] [address]  [place], [place], [address] Phone: [phone]   Fax:  [phone]  Physical Therapy Treatment  Patient Details  Name: Tyler [name] MRN: [id number] Date of Birth: 1966-06-25 Referring Provider (PT): Meridee Score, MD   Encounter Date: 09/21/2019  PT End of Session - 09/21/19 1023    Visit Number  8    Number of Visits  19    Date for PT Re-Evaluation  10/21/19    PT Start Time  1017    PT Stop Time  1118   Ended visit with 10 min moist heat   PT Time Calculation (min)  61 min    Activity Tolerance  Patient tolerated treatment well    Behavior During Therapy  [facility] for tasks assessed/performed       Past Medical History:  Diagnosis Date  . Arthritis   . Hypothyroid   . Pneumonia     Past Surgical History:  Procedure Laterality Date  . HAND TENDON SURGERY Left   . QUADRICEPS TENDON REPAIR Left 06/13/2015   Procedure: REPAIR LEFT QUADRICEP TENDON;  Surgeon: Newt Minion, MD;  Location: [place];  Service: Orthopedics;  Laterality: Left;  . TOTAL KNEE ARTHROPLASTY Right 08/17/2019  . TOTAL KNEE ARTHROPLASTY Right 08/17/2019   Procedure: RIGHT TOTAL KNEE ARTHROPLASTY;  Surgeon: Newt Minion, MD;  Location: [place];  Service: Orthopedics;  Laterality: Right;    There were no vitals filed for this visit.  Subjective Assessment - 09/21/19 1023    Subjective  Doing well and feels his knee is "loosening up".    Patient Stated Goals  "I want to get back to hiking and play disc golf again without pain"    Currently in Pain?  No/denies    Pain Score  0-No pain    Pain Location  Knee    Pain Orientation  Right         [facility] PT Assessment - 09/21/19 0001      AROM   AROM Assessment Site  Knee    Right/Left Knee  Right    Right Knee Extension  7    Right Knee Flexion  118      PROM   PROM Assessment Site  Knee    Right/Left Knee  Right    Right Knee Extension  6    Right  Knee Flexion  121      Strength   Strength Assessment Site  Hip;Knee;Ankle    Right/Left Hip  Right;Left    Right Hip Flexion  4+/5    Right Hip ABduction  4+/5    Right Hip ADduction  4+/5    Left Hip Flexion  4+/5    Left Hip ABduction  4+/5    Left Hip ADduction  4+/5    Right/Left Knee  Right;Left    Right Knee Flexion  4+/5    Right Knee Extension  4+/5    Left Knee Flexion  4+/5    Left Knee Extension  5/5    Right/Left Ankle  Right;Left    Right Ankle Plantar Flexion  4+/5    Left Ankle Plantar Flexion  4+/5                   [facility] Adult PT Treatment/Exercise - 09/21/19 0001      Knee/Hip Exercises: Stretches   Passive Hamstring Stretch  Right;2 reps;30 seconds    Passive Hamstring Stretch Limitations  supine with strap    Hip Flexor Stretch  Right;2 reps;30 seconds    Hip Flexor Stretch Limitations  mod thomas with strap    ITB Stretch  Right;1 rep;30 seconds    ITB Stretch Limitations  supine with strap     Gastroc Stretch  Right;2 reps;30 seconds    Gastroc Stretch Limitations  Prostretch       Knee/Hip Exercises: Aerobic   Recumbent Bike  lvl 2, 7 min       Knee/Hip Exercises: Standing   Step Down  Right;10 reps;Step Height: 4";Hand Hold: 2    Step Down Limitations  holding onto machine bolster     Functional Squat  15 reps;3 seconds    Functional Squat Limitations  at counter to wooden box       Modalities   Modalities  Moist Heat      Moist Heat Therapy   Number Minutes Moist Heat  10 Minutes    Moist Heat Location  Knee   posterior distal HS     Manual Therapy   Manual Therapy  Joint mobilization;Soft tissue mobilization;Myofascial release    Manual therapy comments  supine    Joint Mobilization  R patellar mobs in all directions grade III to tolerance; R knee extension mobs grade III with 1/2 bolster under heels   improved ext ROM after mobs              PT Short Term Goals - 09/21/19 1045      PT SHORT TERM GOAL #1    Title  Pt will be independent in his HEP.    Baseline  issued initial HEP today    Time  3    Period  Weeks    Status  Achieved    Target Date  09/15/19      PT SHORT TERM GOAL #2   Title  Pt will be able to perform active SLR with no extensor lag note and hold 10 seconds    Baseline  unable to lift LE off the mat table    Time  3    Period  Weeks    Status  Partially Met   09/21/19:  able to hold for 10 sec however 1 dg quad lag       PT Long Term Goals - 09/21/19 1047      PT LONG TERM GOAL #1   Title  Pt will be able to walk 1 mile with no assistive device with pain </= 2/10.    Baseline  walking only short distances with RW    Time  6    Period  Weeks    Status  Partially Met   09/21/19:  able to perform however requiring SPC for longer distances like 1 mile - with tightness and fatigue rather than pain     PT LONG TERM GOAL #2   Title  Pt will be able to improve his R knee flexion to >/= 120 degrees in order to improve gait and functional mobillty.    Baseline  AROM: 20-75 degrees    Time  6    Period  Weeks    Status  Partially Met   09/21/19:  AROM 7-118 dg,  PROM 6-121 dg     PT LONG TERM GOAL #3   Title  Pt will improve his R LE strength in knee flexion and extension to >/= 4/5.    Baseline  extension: 2/5, flexion: 3/5    Time  6    Period  Weeks    Status  Achieved   09/21/19     PT LONG TERM GOAL #4   Title  Pt will be independent in his HEP and progression.    Time  6    Period  Weeks    Status  Partially Met      PT LONG TERM GOAL #5   Title  Pt will be able to actively pick up object off the ground  from standing position deomonstrating correct body mechanics.    Time  6    Period  Weeks    Status  Achieved            Plan - 09/21/19 1051    Clinical Impression Statement  Tyler [name] has made good progress with therapy.  Able to partially achieve STG #2 able to hold SLR positioning for 10 sec however with 1 dg quad lag.  R knee ROM improving,  AROM progressed to 7-118 dg, PROM 6-121 dg.  LTG #2 partially achieved.  LTG #3 achieved today as pt. able to demo R knee flexion, extension ROM of 4+/5.  Achieved LTG #5 today able to pick up object from the ground using good overall body mechanics.  Pt. gait mechanics without SPC improving however primarily limited in R heel strike and mechanics by lack of TKE which is still limited in PROM by 6 dg however has showed steady improving in extension ROM.  Heavy MT and therex focus on extension ROM today with some improvement and pt. verbalizing good overall understanding of need for continued extension HEP focus.  Ended visit with trial of moist heat to posterior distal HS musculature for relaxation.  Will continue to progress toward goals.    Comorbidities  past surgeries on L knee, OA, hypothyroidism    Rehab Potential  Excellent    PT Treatment/Interventions  Cryotherapy;Electrical Stimulation;Gait training;Stair training;Balance training;Therapeutic exercise;Therapeutic activities;Functional mobility training;Neuromuscular re-education;Patient/family education;Manual techniques;Passive range of motion;Taping;Vasopneumatic Device    PT Next Visit Plan  R HS STM and stretching, stair training    PT Home Exercise Plan  Access Code: [email or web address] (quad sets, SLR, hip abd in sidelying,    Consulted and Agree with Plan of Care  Patient       Patient will benefit from skilled therapeutic intervention in order to improve the following deficits and impairments:  Pain, Postural dysfunction, Increased edema, Decreased strength, Impaired flexibility, Decreased range of motion, Difficulty walking, Decreased balance, Decreased mobility, Abnormal gait  Visit Diagnosis: Acute pain of right knee  Stiffness of right knee, not elsewhere classified  Difficulty in walking, not elsewhere classified  Localized edema     Problem List Patient Active Problem List   Diagnosis Date Noted  . Arthritis of right knee  08/17/2019    Bess Harvest, PTA 09/21/19 12:24 PM    [facility] [address]  [place], [place], [address] Phone: [phone]   Fax:  [phone]  Name: Tyler [name] MRN: [id number] Date of Birth: [birth year]

## 2019-09-23 ENCOUNTER — Ambulatory Visit: Payer: BC Managed Care – PPO | Admitting: Physical Therapy

## 2019-09-23 ENCOUNTER — Other Ambulatory Visit: Payer: Self-pay

## 2019-09-23 ENCOUNTER — Encounter: Payer: Self-pay | Admitting: Physical Therapy

## 2019-09-23 DIAGNOSIS — R600 Localized edema: Secondary | ICD-10-CM

## 2019-09-23 DIAGNOSIS — M25561 Pain in right knee: Secondary | ICD-10-CM | POA: Diagnosis not present

## 2019-09-23 DIAGNOSIS — M25661 Stiffness of right knee, not elsewhere classified: Secondary | ICD-10-CM

## 2019-09-23 DIAGNOSIS — R262 Difficulty in walking, not elsewhere classified: Secondary | ICD-10-CM

## 2019-09-23 NOTE — Therapy (Signed)
[facility] [address]  [place], [place], [address] Phone: [phone]   Fax:  [phone]  Physical Therapy Treatment  Patient Details  Name: Tyler [name] MRN: [id number] Date of Birth: Dec 29, 1965 Referring Provider (PT): Meridee Score, MD   Encounter Date: 09/23/2019  PT End of Session - 09/23/19 0846    Visit Number  9    Number of Visits  19    Date for PT Re-Evaluation  10/21/19    PT Start Time  0801    PT Stop Time  0845    PT Time Calculation (min)  44 min    Activity Tolerance  Patient tolerated treatment well;Patient limited by pain    Behavior During Therapy  [facility] for tasks assessed/performed       Past Medical History:  Diagnosis Date  . Arthritis   . Hypothyroid   . Pneumonia     Past Surgical History:  Procedure Laterality Date  . HAND TENDON SURGERY Left   . QUADRICEPS TENDON REPAIR Left 06/13/2015   Procedure: REPAIR LEFT QUADRICEP TENDON;  Surgeon: Newt Minion, MD;  Location: [place];  Service: Orthopedics;  Laterality: Left;  . TOTAL KNEE ARTHROPLASTY Right 08/17/2019  . TOTAL KNEE ARTHROPLASTY Right 08/17/2019   Procedure: RIGHT TOTAL KNEE ARTHROPLASTY;  Surgeon: Newt Minion, MD;  Location: [place];  Service: Orthopedics;  Laterality: Right;    There were no vitals filed for this visit.  Subjective Assessment - 09/23/19 0804    Subjective  Yesterday he has some increased tightness without known cause. Feeling a little better today.    Patient Stated Goals  "I want to get back to hiking and play disc golf again without pain"    Currently in Pain?  No/denies         [facility] PT Assessment - 09/23/19 0001      AROM   Right Knee Extension  6    Right Knee Flexion  110      PROM   Right Knee Extension  5    Right Knee Flexion  117                   [facility] Adult PT Treatment/Exercise - 09/23/19 0001      Self-Care   Self-Care  Other Self-Care Comments     Other Self-Care Comments   review and demonstration of foam roll and ball massage to R distal HS for tension and pain relief      Knee/Hip Exercises: Aerobic   Recumbent Bike  lvl 2, 6 min       Knee/Hip Exercises: Standing   Forward Step Up  Right;10 reps;Hand Hold: 1;Step Height: 6";1 set    Forward Step Up Limitations  + TKE with blue band    SLS with Vectors  R SLS + forward/back reach to pincushions x10 with 1 UE on TM rail      Knee/Hip Exercises: Seated   Long Arc Quad  AROM;Right;1 set;10 reps    Long Arc Quad Weight  2 lbs.    Long [employer] Limitations  lacking TKE   cues to decrease speed     Knee/Hip Exercises: Supine   Quad Sets  Right;10 reps;Strengthening    Quad Sets Limitations  10x10" with heels on bolster    Straight Leg Raises  Right;Strengthening;10 reps    Straight Leg Raises Limitations  2#; mild quad lag and lacking TKE  Manual Therapy   Manual Therapy  Soft tissue mobilization;Myofascial release;Muscle Energy Technique    Manual therapy comments  prone, sitting    Soft tissue mobilization  STM to R medial and lateral distal HS and proximal gastroc- soft tissue restriction in medial HS    Myofascial Release  TPR to R medial  HS- tender trigger pts evident throughout    Muscle Energy Technique  L knee flexion MET 5x5" with knee extension PROM 5x20" to tolerance             PT Education - 09/23/19 0846    Education Details  educated on step up + TKE exercise at home    Person(s) Educated  Patient    Methods  Explanation;Demonstration;Tactile cues;Verbal cues    Comprehension  Verbalized understanding;Returned demonstration       PT Short Term Goals - 09/21/19 1045      PT SHORT TERM GOAL #1   Title  Pt will be independent in his HEP.    Baseline  issued initial HEP today    Time  3    Period  Weeks    Status  Achieved    Target Date  09/15/19      PT SHORT TERM GOAL #2   Title  Pt will be able to perform active SLR with no extensor lag  note and hold 10 seconds    Baseline  unable to lift LE off the mat table    Time  3    Period  Weeks    Status  Partially Met   09/21/19:  able to hold for 10 sec however 1 dg quad lag       PT Long Term Goals - 09/21/19 1047      PT LONG TERM GOAL #1   Title  Pt will be able to walk 1 mile with no assistive device with pain </= 2/10.    Baseline  walking only short distances with RW    Time  6    Period  Weeks    Status  Partially Met   09/21/19:  able to perform however requiring SPC for longer distances like 1 mile - with tightness and fatigue rather than pain     PT LONG TERM GOAL #2   Title  Pt will be able to improve his R knee flexion to >/= 120 degrees in order to improve gait and functional mobillty.    Baseline  AROM: 20-75 degrees    Time  6    Period  Weeks    Status  Partially Met   09/21/19:  AROM 7-118 dg,  PROM 6-121 dg     PT LONG TERM GOAL #3   Title  Pt will improve his R LE strength in knee flexion and extension to >/= 4/5.    Baseline  extension: 2/5, flexion: 3/5    Time  6    Period  Weeks    Status  Achieved   09/21/19     PT LONG TERM GOAL #4   Title  Pt will be independent in his HEP and progression.    Time  6    Period  Weeks    Status  Partially Met      PT LONG TERM GOAL #5   Title  Pt will be able to actively pick up object off the ground  from standing position deomonstrating correct body mechanics.    Time  6    Period  Weeks  Status  Achieved            Plan - 09/23/19 0847    Clinical Impression Statement  Patient reporting slightly increased tightness and stiffness in R knee yesterday without known cause. Addressed HS tightness with STM and TPR- patient demonstrating soft tissue restriction and trigger points in medial HS today, but without tenderness. Worked on knee extension ROM with MET and knee extension PROM to patient's tolerance. Patient tolerated quad strengthening ther-ex after manual therapy for hopeful continued  improvement in ROM. Demonstrated good form and effort with step ups with additional TKE resistance. ROM measured at end of session- patient reaching 6-110 degrees AROM, and 5-117 degrees PROM. Patient declined modalities at end of session and with no complaints. Patient progressing well towards goals.    Comorbidities  past surgeries on L knee, OA, hypothyroidism    Rehab Potential  Excellent    PT Treatment/Interventions  Cryotherapy;Electrical Stimulation;Gait training;Stair training;Balance training;Therapeutic exercise;Therapeutic activities;Functional mobility training;Neuromuscular re-education;Patient/family education;Manual techniques;Passive range of motion;Taping;Vasopneumatic Device    PT Next Visit Plan  R HS STM and stretching, stair training    PT Home Exercise Plan  Access Code: [email or web address] (quad sets, SLR, hip abd in sidelying,    Consulted and Agree with Plan of Care  Patient       Patient will benefit from skilled therapeutic intervention in order to improve the following deficits and impairments:  Pain, Postural dysfunction, Increased edema, Decreased strength, Impaired flexibility, Decreased range of motion, Difficulty walking, Decreased balance, Decreased mobility, Abnormal gait  Visit Diagnosis: Acute pain of right knee  Stiffness of right knee, not elsewhere classified  Difficulty in walking, not elsewhere classified  Localized edema     Problem List Patient Active Problem List   Diagnosis Date Noted  . Arthritis of right knee 08/17/2019    Janene Harvey, PT, DPT 09/23/19 12:19 PM   [facility] [address]  [place], [place], [address] Phone: [phone]   Fax:  [phone]  Name: BERTIE SIMIEN MRN: [id number] Date of Birth: Jun 02, 1966

## 2019-09-26 ENCOUNTER — Encounter: Payer: BC Managed Care – PPO | Admitting: Physical Therapy

## 2019-09-26 ENCOUNTER — Ambulatory Visit (INDEPENDENT_AMBULATORY_CARE_PROVIDER_SITE_OTHER): Payer: BC Managed Care – PPO | Admitting: Physician Assistant

## 2019-09-26 ENCOUNTER — Other Ambulatory Visit: Payer: Self-pay

## 2019-09-26 ENCOUNTER — Encounter: Payer: Self-pay | Admitting: Physician Assistant

## 2019-09-26 VITALS — Ht 68.0 in | Wt 220.0 lb

## 2019-09-26 DIAGNOSIS — Z96651 Presence of right artificial knee joint: Secondary | ICD-10-CM

## 2019-09-26 NOTE — Progress Notes (Signed)
Office Visit Note   Patient: Tyler [name]           Date of Birth: 1965-11-21           MRN: [id number] Visit Date: 09/26/2019              Requested by: Delilah Shan, MD [address] [place],  [place] [address] PCP: Delilah Shan, MD  Chief Complaint  Patient presents with  . Right Knee - Routine Post Op    08/17/2019 right TKA      HPI: 6 weeks s/p Right knee replacement. He is feeling well and is working with PT. Feels better than before surgery when he is at rest  Assessment & Plan: Visit Diagnoses: Right Knee replacement  Plan: Xrays next visit. Continue PT. Focus on achieving extension.   Follow-Up Instructions: No follow-ups on file.   Ortho Exam  Patient is alert, oriented, no adenopathy, well-dressed, normal affect, normal respiratory effort. Right knee: Healed surgical incision. Moderate soft tissue swelling. No erythema or cellulitis. Compartments soft.  Lacking 6 degrees extension flexion to 116 degrees  Imaging: No results found. No images are attached to the encounter.  Labs: No results found for: HGBA1C, ESRSEDRATE, CRP, LABURIC, REPTSTATUS, GRAMSTAIN, CULT, LABORGA   Lab Results  Component Value Date   ALBUMIN 3.6 06/13/2015    No results found for: MG No results found for: VD25OH  No results found for: PREALBUMIN CBC EXTENDED Latest Ref Rng & Units 08/15/2019 06/13/2015  WBC 4.0 - 10.5 K/uL 4.6 5.8  RBC 4.22 - 5.81 MIL/uL 4.60 4.54  HGB 13.0 - 17.0 g/dL 14.0 13.9  HCT 39.0 - 52.0 % 42.0 41.5  PLT 150 - 400 K/uL 196 194     Body mass index is 33.45 kg/m.  Orders:  No orders of the defined types were placed in this encounter.  No orders of the defined types were placed in this encounter.    Procedures: No procedures performed  Clinical Data: No additional findings.  ROS:  All other systems negative, except as noted in the HPI. Review of Systems  Objective: Vital Signs: Ht 5\' 8"  (1.727 m)   Wt 220 lb  (99.8 kg)   BMI 33.45 kg/m   Specialty Comments:  No specialty comments available.  PMFS History: Patient Active Problem List   Diagnosis Date Noted  . Arthritis of right knee 08/17/2019   Past Medical History:  Diagnosis Date  . Arthritis   . Hypothyroid   . Pneumonia     Family History  Problem Relation Age of Onset  . Heart attack Father     Past Surgical History:  Procedure Laterality Date  . HAND TENDON SURGERY Left   . QUADRICEPS TENDON REPAIR Left 06/13/2015   Procedure: REPAIR LEFT QUADRICEP TENDON;  Surgeon: Newt Minion, MD;  Location: [place];  Service: Orthopedics;  Laterality: Left;  . TOTAL KNEE ARTHROPLASTY Right 08/17/2019  . TOTAL KNEE ARTHROPLASTY Right 08/17/2019   Procedure: RIGHT TOTAL KNEE ARTHROPLASTY;  Surgeon: Newt Minion, MD;  Location: [place];  Service: Orthopedics;  Laterality: Right;   Social History   Occupational History  . Not on file  Tobacco Use  . Smoking status: Former Smoker    Quit date: 06/12/1995    Years since quitting: 24.3  . Smokeless tobacco: Never Used  Substance and Sexual Activity  . Alcohol use: Yes    Comment: 6 drinks/week  . Drug use: No  .  Sexual activity: Not on file

## 2019-09-28 ENCOUNTER — Other Ambulatory Visit: Payer: Self-pay

## 2019-09-28 ENCOUNTER — Ambulatory Visit: Payer: BC Managed Care – PPO

## 2019-09-28 DIAGNOSIS — M25561 Pain in right knee: Secondary | ICD-10-CM | POA: Diagnosis not present

## 2019-09-28 DIAGNOSIS — R600 Localized edema: Secondary | ICD-10-CM

## 2019-09-28 DIAGNOSIS — R262 Difficulty in walking, not elsewhere classified: Secondary | ICD-10-CM

## 2019-09-28 DIAGNOSIS — M25661 Stiffness of right knee, not elsewhere classified: Secondary | ICD-10-CM

## 2019-09-28 NOTE — Therapy (Signed)
[facility] [address]  [place], [place], [address] Phone: [phone]   Fax:  [phone]  Physical Therapy Treatment  Patient Details  Name: Tyler [name] MRN: [id number] Date of Birth: [birth year] Referring Provider (PT): Meridee Score, MD   Encounter Date: 09/28/2019  PT End of Session - 09/28/19 0814    Visit Number  10    Number of Visits  19    Date for PT Re-Evaluation  10/21/19    PT Start Time  0801    PT Stop Time  2197    PT Time Calculation (min)  54 min    Activity Tolerance  Patient tolerated treatment well;Patient limited by pain    Behavior During Therapy  [facility] for tasks assessed/performed       Past Medical History:  Diagnosis Date  . Arthritis   . Hypothyroid   . Pneumonia     Past Surgical History:  Procedure Laterality Date  . HAND TENDON SURGERY Left   . QUADRICEPS TENDON REPAIR Left 06/13/2015   Procedure: REPAIR LEFT QUADRICEP TENDON;  Surgeon: Newt Minion, MD;  Location: [place];  Service: Orthopedics;  Laterality: Left;  . TOTAL KNEE ARTHROPLASTY Right 08/17/2019  . TOTAL KNEE ARTHROPLASTY Right 08/17/2019   Procedure: RIGHT TOTAL KNEE ARTHROPLASTY;  Surgeon: Newt Minion, MD;  Location: [place];  Service: Orthopedics;  Laterality: Right;    There were no vitals filed for this visit.  Subjective Assessment - 09/28/19 0812    Subjective  Pt. reporting some outer R knee pain most when walking.    Patient Stated Goals  "I want to get back to hiking and play disc golf again without pain"    Currently in Pain?  No/denies    Pain Score  0-No pain    Pain Location  Knee    Pain Orientation  Right    Pain Descriptors / Indicators  Sharp    Pain Type  Surgical pain;Acute pain    Pain Frequency  Intermittent    Aggravating Factors   walking    Multiple Pain Sites  No                       [facility] Adult PT Treatment/Exercise - 09/28/19 0001      Ambulation/Gait    Ambulation/Gait  Yes    Ambulation/Gait Assistance  5: Supervision    Ambulation/Gait Assistance Details  Cues for upright trunk posture and B heel strike - improved mechanics after cueing - mod carryover - pt. verbalized understanding    Ambulation Distance (Feet)  90 Feet    Assistive device  None    Gait Pattern  Decreased stance time - right;Decreased hip/knee flexion - right;Antalgic;Step-through pattern;Right flexed knee in stance;Lateral trunk lean to right    Ambulation Surface  Level;Indoor      Knee/Hip Exercises: Hydrologist  Right;2 reps;30 seconds    Passive Hamstring Stretch Limitations  supine with strap    ITB Stretch  Right;2 reps;30 seconds    Gastroc Stretch  Right;4 reps;30 seconds    Gastroc Stretch Limitations  runners stretch into wall       Knee/Hip Exercises: Aerobic   Recumbent Bike  lvl 2, 7 min       Knee/Hip Exercises: Machines for Strengthening   Cybex Leg Press  B LEs; 35# x 15 reps       Knee/Hip Exercises: Standing  Terminal Knee Extension  Right;10 reps;Theraband;Strengthening    Theraband Level (Terminal Knee Extension)  Level 4 (Blue)    Terminal Knee Extension Limitations  + 8" forward step-up    Lateral Step Up  Right;10 reps;Step Height: 8";Hand Hold: 2    Lateral Step Up Limitations  focusing on eccentric step-down     Functional Squat  15 reps;3 seconds    Functional Squat Limitations  tripple extension at TM      Vasopneumatic   Number Minutes Vasopneumatic   10 minutes    Vasopnuematic Location   Knee    Vasopneumatic Pressure  Medium    Vasopneumatic Temperature   coldest temp      Manual Therapy   Manual Therapy  Soft tissue mobilization;Muscle Energy Technique;Joint mobilization    Manual therapy comments  prone, supine     Joint Mobilization  R patellar mobs in all directions grade III focusing on medial glide - somewhat restricted medial motion er under heels    Soft tissue mobilization  STM to R  medial  HS and proximal gastroc- soft tissue restriction in medial HS    Muscle Energy Technique  L HS contract/relax stretch with therapist for improved ext ROM x 3 rounds in prone                PT Short Term Goals - 09/21/19 1045      PT SHORT TERM GOAL #1   Title  Pt will be independent in his HEP.    Baseline  issued initial HEP today    Time  3    Period  Weeks    Status  Achieved    Target Date  09/15/19      PT SHORT TERM GOAL #2   Title  Pt will be able to perform active SLR with no extensor lag note and hold 10 seconds    Baseline  unable to lift LE off the mat table    Time  3    Period  Weeks    Status  Partially Met   09/21/19:  able to hold for 10 sec however 1 dg quad lag       PT Long Term Goals - 09/21/19 1047      PT LONG TERM GOAL #1   Title  Pt will be able to walk 1 mile with no assistive device with pain </= 2/10.    Baseline  walking only short distances with RW    Time  6    Period  Weeks    Status  Partially Met   09/21/19:  able to perform however requiring SPC for longer distances like 1 mile - with tightness and fatigue rather than pain     PT LONG TERM GOAL #2   Title  Pt will be able to improve his R knee flexion to >/= 120 degrees in order to improve gait and functional mobillty.    Baseline  AROM: 20-75 degrees    Time  6    Period  Weeks    Status  Partially Met   09/21/19:  AROM 7-118 dg,  PROM 6-121 dg     PT LONG TERM GOAL #3   Title  Pt will improve his R LE strength in knee flexion and extension to >/= 4/5.    Baseline  extension: 2/5, flexion: 3/5    Time  6    Period  Weeks    Status  Achieved   09/21/19  PT LONG TERM GOAL #4   Title  Pt will be independent in his HEP and progression.    Time  6    Period  Weeks    Status  Partially Met      PT LONG TERM GOAL #5   Title  Pt will be able to actively pick up object off the ground  from standing position deomonstrating correct body mechanics.    Time  6     Period  Weeks    Status  Achieved            Plan - 09/28/19 0824    Clinical Impression Statement  Shervin doing well today.  Does have complaint of intermittent R lateral knee pain.  Addressed apparent tightness in R lateral hip musculature including lateral HS, glutes for reduction in ITB tightness and hopeful resolution of lateral knee pain.  Gait training focused on B heel strike and upright trunk positioning without AD with good carryover following cueing.  MT focusing on R HS MET for improved extension ROM along with inferior/superior, medial patellar mobility as pt. somewhat restricted.  Progressed step-training with focus on TKE and eccentric quad control with lowering down.  Pt. verbalized increased R knee pain after standing therex thus ended session with ice/compression to R knee to reduce post-exercise soreness and swelling.  Also added moist heat to R proximal HS per pt. request to promote HS relaxation.  Pt. leaving session pain free.  Will continue to focus on improving HS tissue quality and improving extension ROM for TKE and improved gait mechanics in coming sessions.    Comorbidities  past surgeries on L knee, OA, hypothyroidism    Rehab Potential  Excellent    PT Treatment/Interventions  Cryotherapy;Electrical Stimulation;Gait training;Stair training;Balance training;Therapeutic exercise;Therapeutic activities;Functional mobility training;Neuromuscular re-education;Patient/family education;Manual techniques;Passive range of motion;Taping;Vasopneumatic Device    PT Next Visit Plan  R HS STM and stretching, stair training    PT Home Exercise Plan  Access Code: [email or web address] (quad sets, SLR, hip abd in sidelying,    Consulted and Agree with Plan of Care  Patient       Patient will benefit from skilled therapeutic intervention in order to improve the following deficits and impairments:  Pain, Postural dysfunction, Increased edema, Decreased strength, Impaired flexibility, Decreased  range of motion, Difficulty walking, Decreased balance, Decreased mobility, Abnormal gait  Visit Diagnosis: Stiffness of right knee, not elsewhere classified  Acute pain of right knee  Difficulty in walking, not elsewhere classified  Localized edema     Problem List Patient Active Problem List   Diagnosis Date Noted  . Arthritis of right knee 08/17/2019     Bess Harvest, PTA 09/28/19 9:19 AM   [facility] [address]  [place], [place], [address] Phone: [phone]   Fax:  [phone]  Name: CORTLIN MARANO MRN: [id number] Date of Birth: June 05, 1966

## 2019-09-29 ENCOUNTER — Ambulatory Visit: Payer: BC Managed Care – PPO | Admitting: Physical Therapy

## 2019-09-29 ENCOUNTER — Encounter: Payer: Self-pay | Admitting: Physical Therapy

## 2019-09-29 ENCOUNTER — Other Ambulatory Visit: Payer: Self-pay

## 2019-09-29 DIAGNOSIS — M25561 Pain in right knee: Secondary | ICD-10-CM

## 2019-09-29 DIAGNOSIS — R262 Difficulty in walking, not elsewhere classified: Secondary | ICD-10-CM

## 2019-09-29 DIAGNOSIS — M25661 Stiffness of right knee, not elsewhere classified: Secondary | ICD-10-CM

## 2019-09-29 DIAGNOSIS — R600 Localized edema: Secondary | ICD-10-CM

## 2019-09-29 NOTE — Therapy (Signed)
[facility] [address]  [place], [place], [address] Phone: [phone]   Fax:  [phone]  Physical Therapy Treatment  Patient Details  Name: Tyler [name] MRN: [id number] Date of Birth: [birth year] Referring Provider (PT): Meridee Score, MD   Encounter Date: 09/29/2019  PT End of Session - 09/29/19 0851    Visit Number  11    Number of Visits  19    Date for PT Re-Evaluation  10/21/19    PT Start Time  0802    PT Stop Time  0843    PT Time Calculation (min)  41 min    Activity Tolerance  Patient tolerated treatment well    Behavior During Therapy  [facility] for tasks assessed/performed       Past Medical History:  Diagnosis Date  . Arthritis   . Hypothyroid   . Pneumonia     Past Surgical History:  Procedure Laterality Date  . HAND TENDON SURGERY Left   . QUADRICEPS TENDON REPAIR Left 06/13/2015   Procedure: REPAIR LEFT QUADRICEP TENDON;  Surgeon: Newt Minion, MD;  Location: [place];  Service: Orthopedics;  Laterality: Left;  . TOTAL KNEE ARTHROPLASTY Right 08/17/2019  . TOTAL KNEE ARTHROPLASTY Right 08/17/2019   Procedure: RIGHT TOTAL KNEE ARTHROPLASTY;  Surgeon: Newt Minion, MD;  Location: [place];  Service: Orthopedics;  Laterality: Right;    There were no vitals filed for this visit.  Subjective Assessment - 09/29/19 0803    Subjective  "So far so good."    Patient Stated Goals  "I want to get back to hiking and play disc golf again without pain"    Currently in Pain?  No/denies                       [facility] Adult PT Treatment/Exercise - 09/29/19 0001      Ambulation/Gait   Ambulation/Gait  Yes    Ambulation/Gait Assistance  7: Independent    Ambulation/Gait Assistance Details  cues to increase L step length and heel strike on R    Ambulation Distance (Feet)  270 Feet    Assistive device  None    Gait Pattern  Decreased stance time - right;Decreased hip/knee flexion -  right;Antalgic;Step-through pattern;Right flexed knee in stance;Lateral trunk lean to right    Ambulation Surface  Level;Indoor    Stairs  Yes    Stairs Assistance  7: Independent    Stair Management Technique  One rail Right;One rail Left    Number of Stairs  26    Height of Stairs  8    Gait Comments  alternating reciprocal pattern with slightly decreased eccentric contorl on R      Knee/Hip Exercises: Stretches   Passive Hamstring Stretch  Right;2 reps;30 seconds    Passive Hamstring Stretch Limitations  supine with strap    Quad Stretch  Right;30 seconds;4 reps    Quad Stretch Limitations  prone with strap; 2x with pillow under knee      Knee/Hip Exercises: Aerobic   Recumbent Bike  lvl 2, 6 min       Knee/Hip Exercises: Machines for Strengthening   Cybex Knee Extension  B LEs 5x5#; 5x10#   cues to avoid excessive use of L LE   Cybex Knee Flexion  B LEs 10x30#      Knee/Hip Exercises: Standing   Functional Squat  10 reps;2 sets    Functional Squat Limitations  10x TRX squat; 10x squat + lateral step    Other Standing Knee Exercises  TRX side lunges x10 each LE   demonstration and cues for proper form              PT Short Term Goals - 09/21/19 1045      PT SHORT TERM GOAL #1   Title  Pt will be independent in his HEP.    Baseline  issued initial HEP today    Time  3    Period  Weeks    Status  Achieved    Target Date  09/15/19      PT SHORT TERM GOAL #2   Title  Pt will be able to perform active SLR with no extensor lag note and hold 10 seconds    Baseline  unable to lift LE off the mat table    Time  3    Period  Weeks    Status  Partially Met   09/21/19:  able to hold for 10 sec however 1 dg quad lag       PT Long Term Goals - 09/21/19 1047      PT LONG TERM GOAL #1   Title  Pt will be able to walk 1 mile with no assistive device with pain </= 2/10.    Baseline  walking only short distances with RW    Time  6    Period  Weeks    Status   Partially Met   09/21/19:  able to perform however requiring SPC for longer distances like 1 mile - with tightness and fatigue rather than pain     PT LONG TERM GOAL #2   Title  Pt will be able to improve his R knee flexion to >/= 120 degrees in order to improve gait and functional mobillty.    Baseline  AROM: 20-75 degrees    Time  6    Period  Weeks    Status  Partially Met   09/21/19:  AROM 7-118 dg,  PROM 6-121 dg     PT LONG TERM GOAL #3   Title  Pt will improve his R LE strength in knee flexion and extension to >/= 4/5.    Baseline  extension: 2/5, flexion: 3/5    Time  6    Period  Weeks    Status  Achieved   09/21/19     PT LONG TERM GOAL #4   Title  Pt will be independent in his HEP and progression.    Time  6    Period  Weeks    Status  Partially Met      PT LONG TERM GOAL #5   Title  Pt will be able to actively pick up object off the ground  from standing position deomonstrating correct body mechanics.    Time  6    Period  Weeks    Status  Achieved            Plan - 09/29/19 0851    Clinical Impression Statement  Patient with question about "no jumping" precaution given to him by his MD at last F/U appointment. Advised patient to contact his MD's office for clarification on the time frame that he should observe this precaution. Patient reported understanding. Focused session on LE strengthening with cues for TKE and equal weight distribution. Introduced use of TRX cables for focus on B quad and glute strength with minor UE support. Patient pain-free with these  exercises and demonstrated good effort despite report of muscle fatigue. Able to demonstrate good improvement in eccentric control with stairs today. Worked on gait training as patient still demonstrating anterior trunk lean, short step length, and lacking TKE. Cues to increase L step length and heel strike on R enabled patient to ambulate with more upright trunk and continuous gait pattern. Patient without  complaints at end of session. Progressing very well towards goals.    Comorbidities  past surgeries on L knee, OA, hypothyroidism    Rehab Potential  Excellent    PT Treatment/Interventions  Cryotherapy;Electrical Stimulation;Gait training;Stair training;Balance training;Therapeutic exercise;Therapeutic activities;Functional mobility training;Neuromuscular re-education;Patient/family education;Manual techniques;Passive range of motion;Taping;Vasopneumatic Device    PT Next Visit Plan  R HS STM and stretching, stair training    PT Home Exercise Plan  Access Code: [email or web address] (quad sets, SLR, hip abd in sidelying,    Consulted and Agree with Plan of Care  Patient       Patient will benefit from skilled therapeutic intervention in order to improve the following deficits and impairments:  Pain, Postural dysfunction, Increased edema, Decreased strength, Impaired flexibility, Decreased range of motion, Difficulty walking, Decreased balance, Decreased mobility, Abnormal gait  Visit Diagnosis: Stiffness of right knee, not elsewhere classified  Acute pain of right knee  Difficulty in walking, not elsewhere classified  Localized edema     Problem List Patient Active Problem List   Diagnosis Date Noted  . Arthritis of right knee 08/17/2019     Janene Harvey, PT, DPT 09/29/19 8:55 AM   [facility] [address]  [place], [place], [address] Phone: [phone]   Fax:  [phone]  Name: THEDFORD BUNTON MRN: [id number] Date of Birth: 12/11/1965

## 2019-10-03 ENCOUNTER — Other Ambulatory Visit: Payer: Self-pay

## 2019-10-03 ENCOUNTER — Encounter: Payer: Self-pay | Admitting: Physical Therapy

## 2019-10-03 ENCOUNTER — Ambulatory Visit: Payer: BC Managed Care – PPO | Admitting: Physical Therapy

## 2019-10-03 DIAGNOSIS — R262 Difficulty in walking, not elsewhere classified: Secondary | ICD-10-CM

## 2019-10-03 DIAGNOSIS — M25561 Pain in right knee: Secondary | ICD-10-CM | POA: Diagnosis not present

## 2019-10-03 DIAGNOSIS — R600 Localized edema: Secondary | ICD-10-CM

## 2019-10-03 DIAGNOSIS — M25661 Stiffness of right knee, not elsewhere classified: Secondary | ICD-10-CM

## 2019-10-03 NOTE — Therapy (Signed)
[facility] [address]  [place], [place], [address] Phone: [phone]   Fax:  [phone]  Physical Therapy Treatment  Patient Details  Name: Tyler [name] MRN: [id number] Date of Birth: 08/03/1966 Referring Provider (PT): Meridee Score, MD   Encounter Date: 10/03/2019  PT End of Session - 10/03/19 0846    Visit Number  12    Number of Visits  19    Date for PT Re-Evaluation  10/21/19    PT Start Time  0801    PT Stop Time  0843    PT Time Calculation (min)  42 min    Activity Tolerance  Patient tolerated treatment well    Behavior During Therapy  [facility] for tasks assessed/performed       Past Medical History:  Diagnosis Date  . Arthritis   . Hypothyroid   . Pneumonia     Past Surgical History:  Procedure Laterality Date  . HAND TENDON SURGERY Left   . QUADRICEPS TENDON REPAIR Left 06/13/2015   Procedure: REPAIR LEFT QUADRICEP TENDON;  Surgeon: Newt Minion, MD;  Location: [place];  Service: Orthopedics;  Laterality: Left;  . TOTAL KNEE ARTHROPLASTY Right 08/17/2019  . TOTAL KNEE ARTHROPLASTY Right 08/17/2019   Procedure: RIGHT TOTAL KNEE ARTHROPLASTY;  Surgeon: Newt Minion, MD;  Location: [place];  Service: Orthopedics;  Laterality: Right;    There were no vitals filed for this visit.  Subjective Assessment - 10/03/19 0802    Subjective  Doing well today. Feeling like he is ready to wrap up with PT within remaining visits.    Patient Stated Goals  "I want to get back to hiking and play disc golf again without pain"    Currently in Pain?  No/denies                       [facility] Adult PT Treatment/Exercise - 10/03/19 0001      Knee/Hip Exercises: Stretches   Passive Hamstring Stretch  Right;2 reps;30 seconds    Passive Hamstring Stretch Limitations  supine with strap    Quad Stretch  Right;30 seconds;2 reps    Quad Stretch Limitations  prone with strap and pillow under knee    Gastroc Stretch  Right;30 seconds;4 reps   2x prostretch; 1x runner's stretch, 1x toes on wall   Other Knee/Hip Stretches  standing R knee flexion stretch at [employer] rail 10x5"    Other Knee/Hip Stretches  R knee flexion stretch in sitting 10x5"      Knee/Hip Exercises: Aerobic   Recumbent Bike  lvl 2, 6 min       Knee/Hip Exercises: Machines for Strengthening   Cybex Knee Extension  R LE 2x10 5#    Cybex Knee Flexion  R LE 2x10 10#    Cybex Leg Press  R LE 10x2 20#   cues for TKE     Manual Therapy   Manual Therapy  Joint mobilization    Manual therapy comments  supine    Joint Mobilization  R patellar mobs in all grade III/IV to tolerance; R knee extension mobs with heel in chair grade IV 5x20"    [employer] Technique  L knee flexion MET 5x5" with heel in chair followed by knee extension mobs             PT Education - 10/03/19 0841    Education Details  update to HEP    Person(s)  Educated  Patient    Methods  Explanation;Demonstration;Tactile cues;Verbal cues;Handout    Comprehension  Verbalized understanding;Returned demonstration       PT Short Term Goals - 09/21/19 1045      PT SHORT TERM GOAL #1   Title  Pt will be independent in his HEP.    Baseline  issued initial HEP today    Time  3    Period  Weeks    Status  Achieved    Target Date  09/15/19      PT SHORT TERM GOAL #2   Title  Pt will be able to perform active SLR with no extensor lag note and hold 10 seconds    Baseline  unable to lift LE off the mat table    Time  3    Period  Weeks    Status  Partially Met   09/21/19:  able to hold for 10 sec however 1 dg quad lag       PT Long Term Goals - 09/21/19 1047      PT LONG TERM GOAL #1   Title  Pt will be able to walk 1 mile with no assistive device with pain </= 2/10.    Baseline  walking only short distances with RW    Time  6    Period  Weeks    Status  Partially Met   09/21/19:  able to perform however requiring SPC for longer distances  like 1 mile - with tightness and fatigue rather than pain     PT LONG TERM GOAL #2   Title  Pt will be able to improve his R knee flexion to >/= 120 degrees in order to improve gait and functional mobillty.    Baseline  AROM: 20-75 degrees    Time  6    Period  Weeks    Status  Partially Met   09/21/19:  AROM 7-118 dg,  PROM 6-121 dg     PT LONG TERM GOAL #3   Title  Pt will improve his R LE strength in knee flexion and extension to >/= 4/5.    Baseline  extension: 2/5, flexion: 3/5    Time  6    Period  Weeks    Status  Achieved   09/21/19     PT LONG TERM GOAL #4   Title  Pt will be independent in his HEP and progression.    Time  6    Period  Weeks    Status  Partially Met      PT LONG TERM GOAL #5   Title  Pt will be able to actively pick up object off the ground  from standing position deomonstrating correct body mechanics.    Time  6    Period  Weeks    Status  Achieved            Plan - 10/03/19 0847    Clinical Impression Statement  Patient without complaints this AM. Focused beginning of session on LE stretching and joint mobilizations for improved knee ROM. Patient still demonstrating mod-severe tightness in R HS with stretching. Knee extension seemed to improve after joint mobilizations. Initiated gastroc stretching as patient demonstrating decreased heel strike during gait. Patient reporting most benefit from toes-on-wall stretch, thus updated this into HEP. Patient reported understanding. Continued to work on machine LE strengthening with focus on R LE. Patient required intermittent cues for TKE. Ended session without complaints. Patient progressing well towards goals.  Comorbidities  past surgeries on L knee, OA, hypothyroidism    Rehab Potential  Excellent    PT Treatment/Interventions  Cryotherapy;Electrical Stimulation;Gait training;Stair training;Balance training;Therapeutic exercise;Therapeutic activities;Functional mobility training;Neuromuscular  re-education;Patient/family education;Manual techniques;Passive range of motion;Taping;Vasopneumatic Device    PT Next Visit Plan  R HS STM and stretching, stair training    PT Home Exercise Plan  Access Code: [email or web address] (quad sets, SLR, hip abd in sidelying,    Consulted and Agree with Plan of Care  Patient       Patient will benefit from skilled therapeutic intervention in order to improve the following deficits and impairments:  Pain, Postural dysfunction, Increased edema, Decreased strength, Impaired flexibility, Decreased range of motion, Difficulty walking, Decreased balance, Decreased mobility, Abnormal gait  Visit Diagnosis: Stiffness of right knee, not elsewhere classified  Acute pain of right knee  Difficulty in walking, not elsewhere classified  Localized edema     Problem List Patient Active Problem List   Diagnosis Date Noted  . Arthritis of right knee 08/17/2019     Janene Harvey, PT, DPT 10/03/19 8:49 AM   [facility] [address]  [place], [place], [address] Phone: [phone]   Fax:  [phone]  Name: BRYSTON COLOCHO MRN: [id number] Date of Birth: 1965-12-29

## 2019-10-04 ENCOUNTER — Encounter: Payer: Self-pay | Admitting: Physical Therapy

## 2019-10-04 ENCOUNTER — Ambulatory Visit: Payer: BC Managed Care – PPO | Admitting: Physical Therapy

## 2019-10-04 DIAGNOSIS — M25561 Pain in right knee: Secondary | ICD-10-CM | POA: Diagnosis not present

## 2019-10-04 DIAGNOSIS — R600 Localized edema: Secondary | ICD-10-CM

## 2019-10-04 DIAGNOSIS — M25661 Stiffness of right knee, not elsewhere classified: Secondary | ICD-10-CM

## 2019-10-04 DIAGNOSIS — R262 Difficulty in walking, not elsewhere classified: Secondary | ICD-10-CM

## 2019-10-04 NOTE — Therapy (Addendum)
[facility] [address]  [place], [place], [address] Phone: [phone]   Fax:  [phone]  Physical Therapy Treatment  Patient Details  Name: Tyler [name] MRN: [id number] Date of Birth: 1966-04-13 Referring Provider (PT): Meridee Score, MD    Encounter Date: 10/04/2019  PT End of Session - 10/04/19 1237    Visit Number  13    Number of Visits  19    Date for PT Re-Evaluation  10/21/19    PT Start Time  0803    PT Stop Time  0842    PT Time Calculation (min)  39 min    Activity Tolerance  Patient tolerated treatment well    Behavior During Therapy  [facility] for tasks assessed/performed       Past Medical History:  Diagnosis Date  . Arthritis   . Hypothyroid   . Pneumonia     Past Surgical History:  Procedure Laterality Date  . HAND TENDON SURGERY Left   . QUADRICEPS TENDON REPAIR Left 06/13/2015   Procedure: REPAIR LEFT QUADRICEP TENDON;  Surgeon: Newt Minion, MD;  Location: [place];  Service: Orthopedics;  Laterality: Left;  . TOTAL KNEE ARTHROPLASTY Right 08/17/2019  . TOTAL KNEE ARTHROPLASTY Right 08/17/2019   Procedure: RIGHT TOTAL KNEE ARTHROPLASTY;  Surgeon: Newt Minion, MD;  Location: [place];  Service: Orthopedics;  Laterality: Right;    There were no vitals filed for this visit.  Subjective Assessment - 10/04/19 0805    Subjective  No issues after yesterday's session. Feels that he is ready to wrap up with PT today. Feels that he is comfortable with the information given to him. Reports 70-80% improvement in R knee since initial eval. Would like to continue working on extension and strength on his own.    Patient Stated Goals  "I want to get back to hiking and play disc golf again without pain"    Currently in Pain?  No/denies         [facility] PT Assessment - 10/04/19 0001      Observation/Other Assessments   Focus on Therapeutic Outcomes (FOTO)   36% limitation      AROM   Right Knee  Extension  8    Right Knee Flexion  117      PROM   Right Knee Extension  7    Right Knee Flexion  125                   [facility] Adult PT Treatment/Exercise - 10/04/19 0001      Knee/Hip Exercises: Stretches   Passive Hamstring Stretch  Right;2 reps;30 seconds    Passive Hamstring Stretch Limitations  supine with strap    Quad Stretch  Right;30 seconds;2 reps    Quad Stretch Limitations  prone with strap and pillow under knee      Knee/Hip Exercises: Aerobic   Recumbent Bike  lvl 2, 6 min       Knee/Hip Exercises: Supine   Straight Leg Raises  Strengthening;Right;1 set;5 reps    Straight Leg Raises Limitations  mild quad lag evident      Manual Therapy   Manual Therapy  Joint mobilization    Manual therapy comments  supine    Joint Mobilization  R patellar mobs in all grade III/IV to tolerance; R knee extension mobs grade III/IV to tolerance;              PT Education -  10/04/19 1238    Education Details  update/consolidation of HEP; discussion on objective progress and remaining limitations; discussion on return to low impact activities    Person(s) Educated  Patient    Methods  Explanation;Demonstration;Verbal cues;Tactile cues;Handout    Comprehension  Verbalized understanding;Returned demonstration       PT Short Term Goals - 10/04/19 0810      PT SHORT TERM GOAL #1   Title  Pt will be independent in his HEP.    Baseline  issued initial HEP today    Time  3    Period  Weeks    Status  Achieved    Target Date  09/15/19      PT SHORT TERM GOAL #2   Title  Pt will be able to perform active SLR with no extensor lag note and hold 10 seconds    Baseline  unable to lift LE off the mat table    Time  3    Period  Weeks    Status  Partially Met   mild quad lag remaining with SLR       PT Long Term Goals - 10/04/19 0932      PT LONG TERM GOAL #1   Title  Pt will be able to walk 1 mile with no assistive device with pain </= 2/10.    Baseline   walking only short distances with RW    Time  6    Period  Weeks    Status  Achieved   able to walk at least 1 mile without AD or pain     PT LONG TERM GOAL #2   Title  Pt will be able to improve his R knee flexion to >/= 120 degrees in order to improve gait and functional mobillty.    Baseline  AROM: 20-75 degrees    Time  6    Period  Weeks    Status  Partially Met   AROM 8-117 dg,  PROM 7-125 dg     PT LONG TERM GOAL #3   Title  Pt will improve his R LE strength in knee flexion and extension to >/= 4/5.    Baseline  extension: 2/5, flexion: 3/5    Time  6    Period  Weeks    Status  Achieved   09/21/19     PT LONG TERM GOAL #4   Title  Pt will be independent in his HEP and progression.    Time  6    Period  Weeks    Status  Achieved      PT LONG TERM GOAL #5   Title  Pt will be able to actively pick up object off the ground  from standing position deomonstrating correct body mechanics.    Time  6    Period  Weeks    Status  Achieved            Plan - 10/04/19 1236    Clinical Impression Statement  Patient reporting 70-80% improvement in R knee since initial eval. Patient now reporting that he feels comfortable continuing with HEP at home. Would like to return to disc golf- advised patient that as long as he is avoiding high impact activities to his R LE, he should be able to return to this activity. Patient has met walking goal, as he reports that he is able to walk at least 1 mile without use of AD or c/o pain. ROM measurements revealed R knee  AROM 8-117 degrees, PROM 7-125 degrees. Patient with good improvement in flexion, but still showing limitation in extension ROM. Patient has previously met his strength and body mechanics goals, however still demonstrating mild quad lag with SLR. Worked on [employer] with patient's input for max benefit. Patient reported understanding and without further questions. Patient has met or partially met all goals at this time.  Patient is ready to transition to home program d/t current progress and is on 30 day hold form therapy.    Comorbidities  past surgeries on L knee, OA, hypothyroidism    Rehab Potential  Excellent    PT Treatment/Interventions  Cryotherapy;Electrical Stimulation;Gait training;Stair training;Balance training;Therapeutic exercise;Therapeutic activities;Functional mobility training;Neuromuscular re-education;Patient/family education;Manual techniques;Passive range of motion;Taping;Vasopneumatic Device    PT Next Visit Plan  30 day hold at this time    PT Home Exercise Plan  Access Code: [email or web address] (quad sets, SLR, hip abd in sidelying,    Consulted and Agree with Plan of Care  Patient       Patient will benefit from skilled therapeutic intervention in order to improve the following deficits and impairments:  Pain, Postural dysfunction, Increased edema, Decreased strength, Impaired flexibility, Decreased range of motion, Difficulty walking, Decreased balance, Decreased mobility, Abnormal gait  Visit Diagnosis: Stiffness of right knee, not elsewhere classified  Acute pain of right knee  Difficulty in walking, not elsewhere classified  Localized edema     Problem List Patient Active Problem List   Diagnosis Date Noted  . Arthritis of right knee 08/17/2019     Janene Harvey, PT, DPT 10/04/19 12:41 PM   [facility] [address]  [place], [place], [address] Phone: [phone]   Fax:  [phone]  Name: ROMARIO TITH MRN: [id number] Date of Birth: [birth year]   PHYSICAL THERAPY DISCHARGE SUMMARY  Visits from Start of Care: 13  Current functional level related to goals / functional outcomes: See above clinical impression   Remaining deficits: Decreased knee ROM   Education / Equipment: HEP  Plan: Patient agrees to discharge.  Patient goals were partially met. Patient is being discharged due to  meeting the stated rehab goals.  ?????          Janene Harvey, PT, DPT 11/22/19 4:46 PM

## 2019-10-05 ENCOUNTER — Ambulatory Visit: Payer: BC Managed Care – PPO

## 2019-11-08 IMAGING — DX DG FINGER INDEX 2+V*L*
3 series · 3 of 3 positions shown · non-contrast
Comparison: None.

CLINICAL DATA: Patient injured left index finger with a truck dolly
45 minutes prior to arrival.

EXAM:
LEFT INDEX FINGER 2+V

[finger ap]
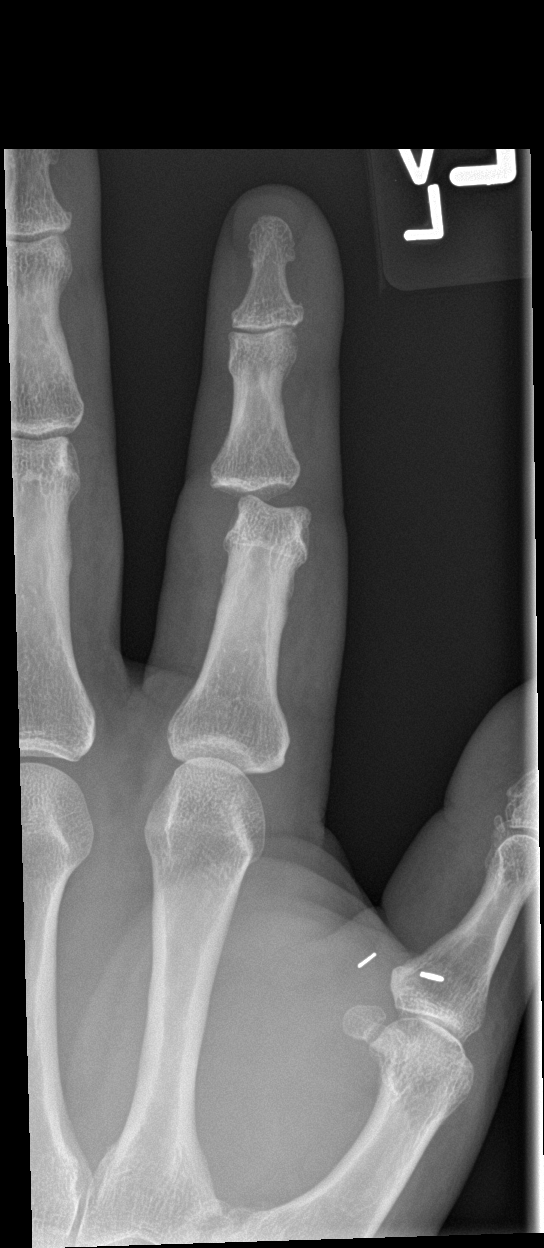

[finger obl]
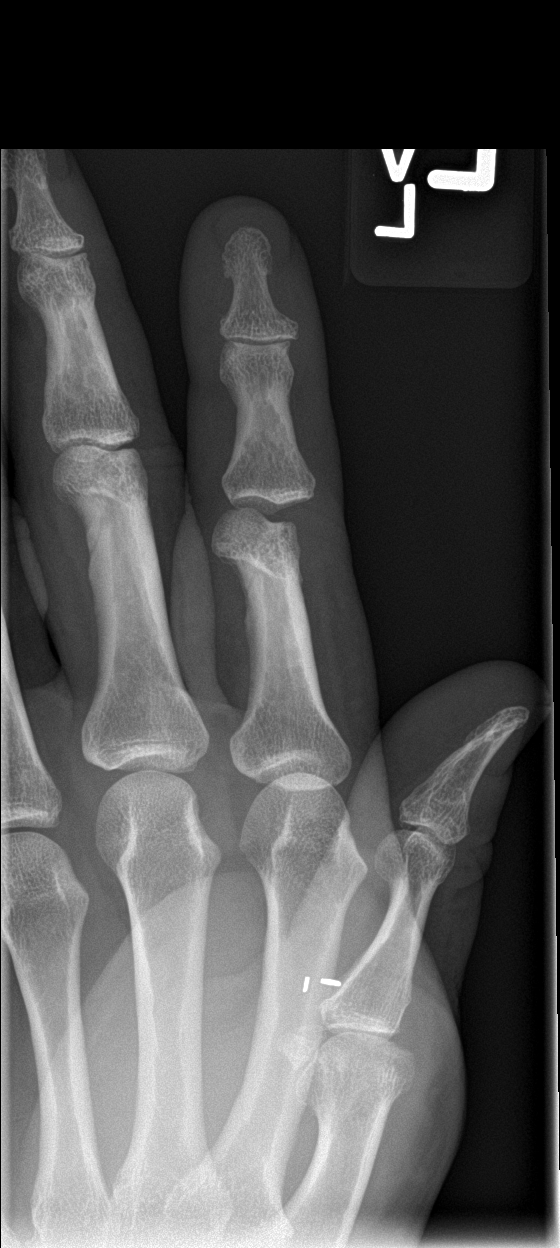

[finger lat]
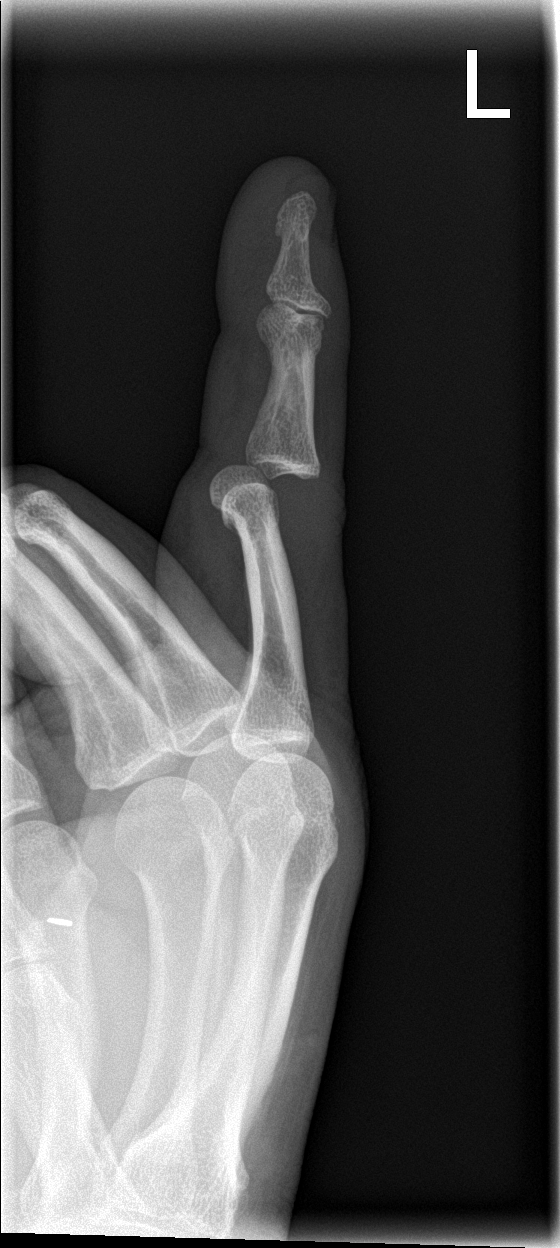

[3 of 3 positions shown; findings below may reference images not displayed]

FINDINGS: Left PIP joint dislocation of the index finger with dorsal and ulnar
displacement of the middle phalanx relative to the head of the
proximal phalanx. No acute fracture or suspicious osseous lesions.
Surgical clips project at the base of the thumb.
IMPRESSION: Acute left PIP joint dislocation of the left index finger with
dorsal and ulnar displacement of the middle phalanx relative to the
head of the proximal phalanx. No fracture identified.

## 2021-09-09 ENCOUNTER — Ambulatory Visit: Payer: Self-pay

## 2021-09-09 ENCOUNTER — Ambulatory Visit (INDEPENDENT_AMBULATORY_CARE_PROVIDER_SITE_OTHER): Payer: BC Managed Care – PPO | Admitting: Orthopedic Surgery

## 2021-09-09 ENCOUNTER — Encounter: Payer: Self-pay | Admitting: Orthopedic Surgery

## 2021-09-09 DIAGNOSIS — M25562 Pain in left knee: Secondary | ICD-10-CM | POA: Diagnosis not present

## 2021-09-09 DIAGNOSIS — Z96651 Presence of right artificial knee joint: Secondary | ICD-10-CM

## 2021-09-09 DIAGNOSIS — M1712 Unilateral primary osteoarthritis, left knee: Secondary | ICD-10-CM

## 2021-09-09 NOTE — Progress Notes (Signed)
Office Visit Note   Patient: Tyler [name]           Date of Birth: September 01, 1966           MRN: [id number] Visit Date: 09/09/2021              Requested by: Wilburn Mylar, MD No address on file PCP: Wilburn Mylar, MD  Chief Complaint  Patient presents with   Left Knee - Pain      HPI: Patient is a 55 year old gentleman who presents with increasing pain of his left knee.  He states he can no longer perform his activities of daily living cannot play golf or disc golf without symptoms.  He has pain with cycling.  Patient states he is pleased with the results of his right total knee arthroplasty.  Assessment & Plan: Visit Diagnoses:  1. Acute pain of left knee   2. Unilateral primary osteoarthritis, left knee   3. S/P total knee arthroplasty, right     Plan: Patient would like to proceed with a left total knee arthroplasty at this time discussed that the patella fixation may be take a little longer to heal due to his previous quad extensor tendon injury with avulsion of the bipartite patella.  Will schedule surgery at patient's earliest convenience.  Follow-Up Instructions: Return if symptoms worsen or fail to improve.   Ortho Exam  Patient is alert, oriented, no adenopathy, well-dressed, normal affect, normal respiratory effort. Examination patient has an antalgic gait he has varus alignment to the left knee.  There is crepitation with range of motion tenderness to palpation of medial lateral joint line patella femoral joint is also tender to palpation.  Collaterals and cruciates are stable.  Imaging: XR Knee 1-2 Views Left  Result Date: 09/09/2021 2 view radiographs of the left knee shows medial joint space narrowing varus alignment with osteophytic bone spurs in all 3 compartments with a bipartite patella.  Patient has a stable well aligned right total knee arthroplasty.  No images are attached to the encounter.  Labs: No results found for: HGBA1C, ESRSEDRATE,  CRP, LABURIC, REPTSTATUS, GRAMSTAIN, CULT, LABORGA   Lab Results  Component Value Date   ALBUMIN 3.6 06/13/2015    No results found for: MG No results found for: VD25OH  No results found for: PREALBUMIN CBC EXTENDED Latest Ref Rng & Units 08/15/2019 06/13/2015  WBC 4.0 - 10.5 K/uL 4.6 5.8  RBC 4.22 - 5.81 MIL/uL 4.60 4.54  HGB 13.0 - 17.0 g/dL 24.5 80.9  HCT 98.3 - 38.2 % 42.0 41.5  PLT 150 - 400 K/uL 196 194     There is no height or weight on file to calculate BMI.  Orders:  Orders Placed This Encounter  Procedures   XR Knee 1-2 Views Left   No orders of the defined types were placed in this encounter.    Procedures: No procedures performed  Clinical Data: No additional findings.  ROS:  All other systems negative, except as noted in the HPI. Review of Systems  Objective: Vital Signs: There were no vitals taken for this visit.  Specialty Comments:  No specialty comments available.  PMFS History: Patient Active Problem List   Diagnosis Date Noted   Arthritis of right knee 08/17/2019   Past Medical History:  Diagnosis Date   Arthritis    Hypothyroid    Pneumonia     Family History  Problem Relation Age of Onset   Heart attack Father  Past Surgical History:  Procedure Laterality Date   HAND TENDON SURGERY Left    QUADRICEPS TENDON REPAIR Left 06/13/2015   Procedure: REPAIR LEFT QUADRICEP TENDON;  Surgeon: Nadara Mustard, MD;  Location: [facility] OR;  Service: Orthopedics;  Laterality: Left;   TOTAL KNEE ARTHROPLASTY Right 08/17/2019   TOTAL KNEE ARTHROPLASTY Right 08/17/2019   Procedure: RIGHT TOTAL KNEE ARTHROPLASTY;  Surgeon: Nadara Mustard, MD;  Location: [facility] OR;  Service: Orthopedics;  Laterality: Right;   Social History   Occupational History   Not on file  Tobacco Use   Smoking status: Former    Types: Cigarettes    Quit date: 06/12/1995    Years since quitting: 26.2   Smokeless tobacco: Never  Vaping Use   Vaping Use: Never used  Substance  and Sexual Activity   Alcohol use: Yes    Comment: 6 drinks/week   Drug use: No   Sexual activity: Not on file

## 2021-09-11 ENCOUNTER — Encounter: Payer: Self-pay | Admitting: Orthopedic Surgery

## 2021-09-25 ENCOUNTER — Other Ambulatory Visit: Payer: Self-pay

## 2021-10-04 NOTE — Progress Notes (Signed)
Surgical Instructions   Your procedure is scheduled on Friday 10/11/2021.  Report to [facility] Main Entrance "A" at 05:30 A.M., then check in with the Admitting office.  Call [phone] if you have problems or questions between now and the morning of surgery:   Remember: Do not eat after midnight the night before your surgery  You may drink clear liquids until 04:30 the morning of your surgery.   Clear liquids allowed are: Water, Non-Citrus Juices (without pulp), Carbonated Beverages, Clear Tea, Black Coffee Only (NO MILK, CREAM, or POWDERED CREAMER of any kind), and Gatorade   Take these medicines the morning of surgery with A SIP OF WATER:  Levothyroxine (Synthroid)   If needed you may take these medications the morning of surgery:    As of today, STOP taking any Aspirin (unless otherwise instructed by your surgeon) or Aspirin-containing products; NSAIDS - Aleve, Naproxen, Ibuprofen, Motrin, Advil, Goody's, BC's, all herbal medications, fish oil, and all vitamins.   3 days leading up to your surgery OR after your pre-procedure COVID test You are not required to quarantine however you are required to wear a well-fitting mask when you are out and around people not in your household.  If your mask becomes wet or soiled, replace with a new one.  Wash your hands often with soap and water for 20 seconds or clean your hands with an alcohol-based hand sanitizer that contains at least 60% alcohol.  Do not share personal items.  Notify your provider: if you are in close contact with someone who has COVID  or if you develop a fever of 100.4 or greater, sneezing, cough, sore throat, shortness of breath or body aches.          Do not wear jewelry  Do not wear lotions, powders, colognes, or deodorant.  Do not shave 48 hours prior to surgery.  Men may shave face and neck.  Do not bring valuables to the hospital - [facility] is not responsible for any belongings or valuables.  Do NOT  Smoke (Tobacco/Vaping) or drink Alcohol 24 hours prior to your procedure  If you use a CPAP at night, please bring your mask for your overnight stay.   Contacts, glasses, hearing aids, dentures or partials may not be worn into surgery, please bring cases for these belongings   For patients admitted to the hospital, discharge time will be determined by your treatment team.   Patients discharged the day of surgery will not be allowed to drive home, and someone needs to stay with them for 24 hours.  NO VISITORS WILL BE ALLOWED IN PRE-OP WHERE PATIENTS ARE PREPPED FOR SURGERY.  ONLY 1 SUPPORT PERSON MAY BE PRESENT IN THE WAITING ROOM WHILE YOU ARE IN SURGERY.  IF YOU ARE TO BE ADMITTED, ONCE YOU ARE IN YOUR ROOM YOU WILL BE ALLOWED TWO (2) VISITORS. 1 (ONE) VISITOR MAY STAY OVERNIGHT BUT MUST ARRIVE TO THE ROOM BY 8pm.  Minor children may have two parents present. Special consideration for safety and communication needs will be reviewed on a case by case basis.  Special instructions:    Oral Hygiene is also important to reduce your risk of infection.  Remember - BRUSH YOUR TEETH THE MORNING OF SURGERY WITH YOUR REGULAR TOOTHPASTE   [facility]- Preparing For Surgery  Before surgery, you can play an important role. Because skin is not sterile, your skin needs to be as free of germs as possible. You can reduce the number of germs on  your skin by washing with CHG (chlorahexidine gluconate) Soap before surgery.  CHG is an antiseptic cleaner which kills germs and bonds with the skin to continue killing germs even after washing.     Please do not use if you have an allergy to CHG or antibacterial soaps. If your skin becomes reddened/irritated stop using the CHG.  Do not shave (including legs and underarms) for at least 48 hours prior to first CHG shower. It is OK to shave your face.  Please follow these instructions carefully.     Shower the NIGHT BEFORE SURGERY and the MORNING OF SURGERY with CHG  Soap.   If you chose to wash your hair, wash your hair first as usual with your normal shampoo. After you shampoo, rinse your hair and body thoroughly to remove the shampoo.    Then [employer] and genitals (private parts) with your normal soap and rinse thoroughly to remove soap.  Next use the CHG Soap as you would any other liquid soap. You can apply CHG directly to the skin and wash gently with a clean washcloth.   Apply the CHG Soap to your body ONLY FROM THE NECK DOWN.  Do not use on open wounds or open sores. Avoid contact with your eyes, ears, mouth and genitals (private parts). Wash Face and genitals (private parts)  with your normal soap.   Wash thoroughly, paying special attention to the area where your surgery will be performed.  Thoroughly rinse your body with warm water from the neck down.  DO NOT shower/wash with your normal soap after using and rinsing off the CHG Soap.  Pat yourself dry with a CLEAN TOWEL.  Wear CLEAN PAJAMAS to bed the night before surgery  Place CLEAN SHEETS on your bed the night before your surgery  DO NOT SLEEP WITH PETS.   Day of Surgery:  Take a shower with CHG soap. Wear Clean/Comfortable clothing the morning of surgery Do not apply any deodorants/lotions.   Remember to brush your teeth WITH YOUR REGULAR TOOTHPASTE.   Please read over the fact sheets that you were given.

## 2021-10-08 ENCOUNTER — Encounter (HOSPITAL_COMMUNITY): Payer: Self-pay

## 2021-10-08 ENCOUNTER — Encounter (HOSPITAL_COMMUNITY)
Admission: RE | Admit: 2021-10-08 | Discharge: 2021-10-08 | Disposition: A | Discharge status: Home / Self Care | Payer: BC Managed Care – PPO | Source: Ambulatory Visit | Attending: Orthopedic Surgery | Admitting: Orthopedic Surgery

## 2021-10-08 ENCOUNTER — Other Ambulatory Visit: Payer: Self-pay

## 2021-10-08 VITALS — BP 141/101 | HR 92 | Temp 98.2°F | Resp 18 | Ht 68.0 in | Wt 226.0 lb

## 2021-10-08 DIAGNOSIS — Z20822 Contact with and (suspected) exposure to covid-19: Secondary | ICD-10-CM | POA: Diagnosis not present

## 2021-10-08 DIAGNOSIS — M1711 Unilateral primary osteoarthritis, right knee: Secondary | ICD-10-CM | POA: Diagnosis not present

## 2021-10-08 DIAGNOSIS — Z01818 Encounter for other preprocedural examination: Secondary | ICD-10-CM | POA: Diagnosis present

## 2021-10-08 HISTORY — DX: Essential (primary) hypertension: I10

## 2021-10-08 LAB — CBC
HCT: 40.8 % (ref 39.0–52.0)
Hemoglobin: 14.1 g/dL (ref 13.0–17.0)
MCH: 31.1 pg (ref 26.0–34.0)
MCHC: 34.6 g/dL (ref 30.0–36.0)
MCV: 89.9 fL (ref 80.0–100.0)
Platelets: 230 10*3/uL (ref 150–400)
RBC: 4.54 MIL/uL (ref 4.22–5.81)
RDW: 12.8 % (ref 11.5–15.5)
WBC: 5.9 10*3/uL (ref 4.0–10.5)
nRBC: 0 % (ref 0.0–0.2)

## 2021-10-08 LAB — SURGICAL PCR SCREEN
MRSA, PCR: NEGATIVE
Staphylococcus aureus: NEGATIVE

## 2021-10-08 LAB — SARS CORONAVIRUS 2 (TAT 6-24 HRS): SARS Coronavirus 2: NEGATIVE

## 2021-10-08 NOTE — Progress Notes (Signed)
PCP - Burnett Kanaris, PA  EKG - today  Aspirin Instructions: uses as needed, has not used it for a while, states he will not use it prior to surgery  ERAS Protcol - yer PRE-SURGERY Ensure given to pt, instructed to drink it just before 4:30 AM morning of surgery   COVID TEST- today  Anesthesia review: No  Patient denies shortness of breath, fever, cough and chest pain at PAT appointment   All instructions explained to the patient, with a verbal understanding of the material. Patient agrees to go over the instructions while at home for a better understanding. Patient also instructed to self quarantine after being tested for COVID-19. The opportunity to ask questions was provided.

## 2021-10-08 NOTE — Pre-Procedure Instructions (Addendum)
Tyler [name]  10/08/2021    Your procedure is scheduled on Friday, October 11, 2021 at 7:30 AM.   Report to [facility] Entrance "A" Admitting Office at 5:30 AM.   Call this number if you have problems the morning of surgery: [phone]   Questions prior to day of surgery, please call [phone] between 8 & 4 PM.    Remember:  Do not eat food after midnight Thursday, 10/10/21  You may drink clear liquids until 4:30 AM. Clear liquids allowed are: Water, Juice (non-citric and without pulp - diabetics please choose diet or no sugar options), Carbonated beverages - (diabetics please choose diet or no sugar options), Clear Tea, Black Coffee only (no creamer, milk or cream including half and half), and Gatorade (diabetics please choose diet or no sugar options)  Drink the Pre-Surgery Ensure the morning of surgery just before 4:30 AM. This will be the last liquid you will drink day of surgery.     Take these medicines the morning of surgery with A SIP OF WATER: Levothyroxine (Synthroid)    Do not wear jewelry, make-up or nail polish.  Do not wear lotions, powders, cologne or deodorant.  Men may shave face and neck.  Do not bring valuables to the hospital.  [facility] is not responsible for any belongings or valuables.  Since having a Covid test done today you do not have to quarantine, but we do ask that you wear a mask when you are around people you are not in you house hold. Please replace your mask if it becomes wet or soiled.   Contacts, dentures or bridgework may not be worn into surgery.  Leave your suitcase in the car.  After surgery it may be brought to your room.  For patients admitted to the hospital, discharge time will be determined by your treatment team.  [facility] - Preparing for Surgery  Before surgery, you can play an important role.  Because skin is not sterile, your skin needs to be as free of germs as possible.  You can reduce the number of germs on  you skin by washing with CHG (chlorahexidine gluconate) soap before surgery.  CHG is an antiseptic cleaner which kills germs and bonds with the skin to continue killing germs even after washing.  Oral Hygiene is also important in reducing the risk of infection.  Remember to brush your teeth with your regular toothpaste the morning of surgery.  Please DO NOT use if you have an allergy to CHG or antibacterial soaps.  If your skin becomes reddened/irritated stop using the CHG and inform your nurse when you arrive at Short Stay.  Do not shave (including legs and underarms) for at least 48 hours prior to the first CHG shower.  You may shave your face.  Please follow these instructions carefully:   1.  Shower with CHG Soap the night before surgery and the morning of Surgery.  2.  If you choose to wash your hair, wash your hair first as usual with your normal shampoo.  3.  After you shampoo, rinse your hair and body thoroughly to remove the shampoo. 4.  Use CHG as you would any other liquid soap.  You can apply chg directly to the skin and wash gently with a      scrungie or washcloth.           5.  Apply the CHG Soap to your body ONLY FROM THE NECK DOWN.   Do not  use on open wounds or open sores. Avoid contact with your eyes, ears, mouth and genitals (private parts).  Wash genitals (private parts) with your normal soap, do this prior to using CHG soap.  6.  Wash thoroughly, paying special attention to the area where your surgery will be performed.  7.  Thoroughly rinse your body with warm water from the neck down.  8.  DO NOT shower/wash with your normal soap after using and rinsing off the CHG Soap.  9.  Pat yourself dry with a clean towel.            10.  Wear clean pajamas.            11.  Place clean sheets on your bed the night of your first shower and do not sleep with pets.  Day of Surgery  Shower as above. Do not apply any lotions/deodorants the morning of surgery.   Please wear clean  clothes to the hospital. Remember to brush your teeth with toothpaste.    Please read over the fact sheets that you were given.

## 2021-10-10 MED ORDER — TRANEXAMIC ACID 1000 MG/10ML IV SOLN
2000.0000 mg | INTRAVENOUS | Status: AC
  Administered 2021-10-11: 2000 mg via TOPICAL
  Filled 2021-10-10: qty 20

## 2021-10-10 NOTE — Anesthesia Preprocedure Evaluation (Addendum)
Anesthesia Evaluation  Patient identified by MRN, date of birth, ID band Patient awake    Reviewed: Allergy & Precautions, NPO status , Patient's Chart, lab work & pertinent test results  History of Anesthesia Complications Negative for: history of anesthetic complications  Airway Mallampati: III  TM Distance: >3 FB Neck ROM: Full    Dental no notable dental hx. (+) Teeth Intact, Dental Advisory Given, Poor Dentition   Pulmonary neg pulmonary ROS, former smoker,    Pulmonary exam normal        Cardiovascular hypertension, Pt. on medications negative cardio ROS Normal cardiovascular exam     Neuro/Psych negative neurological ROS  negative psych ROS   GI/Hepatic negative GI ROS, Neg liver ROS,   Endo/Other  negative endocrine ROSHypothyroidism   Renal/GU negative Renal ROS  negative genitourinary   Musculoskeletal negative musculoskeletal ROS (+) Arthritis ,   Abdominal (+) - obese,   Peds negative pediatric ROS (+)  Hematology negative hematology ROS (+)   Anesthesia Other Findings   Reproductive/Obstetrics negative OB ROS                            Anesthesia Physical  Anesthesia Plan  ASA: 2  Anesthesia Plan: Spinal, MAC and Regional   Post-op Pain Management:    Induction: Intravenous  PONV Risk Score and Plan: 2 and Treatment may vary due to age or medical condition, Ondansetron, Midazolam, Dexamethasone and Propofol infusion  Airway Management Planned: Natural Airway and Simple Face Mask  Additional Equipment: None  Intra-op Plan:   Post-operative Plan:   Informed Consent: I have reviewed the patients History and Physical, chart, labs and discussed the procedure including the risks, benefits and alternatives for the proposed anesthesia with the patient or authorized representative who has indicated his/her understanding and acceptance.     Dental advisory  given  Plan Discussed with: CRNA and Anesthesiologist  Anesthesia Plan Comments:        Anesthesia Quick Evaluation

## 2021-10-11 ENCOUNTER — Encounter (HOSPITAL_COMMUNITY)
Admission: RE | Disposition: A | Discharge status: Home / Self Care | Payer: Self-pay | Source: Home / Self Care | Attending: Orthopedic Surgery

## 2021-10-11 ENCOUNTER — Ambulatory Visit (HOSPITAL_COMMUNITY)
Admission: RE | Admit: 2021-10-11 | Discharge: 2021-10-12 | Disposition: A | Discharge status: Home / Self Care | Payer: BC Managed Care – PPO | Attending: Orthopedic Surgery | Admitting: Orthopedic Surgery

## 2021-10-11 ENCOUNTER — Other Ambulatory Visit: Payer: Self-pay

## 2021-10-11 ENCOUNTER — Ambulatory Visit (HOSPITAL_COMMUNITY): Payer: BC Managed Care – PPO | Admitting: Certified Registered"

## 2021-10-11 ENCOUNTER — Encounter (HOSPITAL_COMMUNITY): Payer: Self-pay | Admitting: Orthopedic Surgery

## 2021-10-11 DIAGNOSIS — M1712 Unilateral primary osteoarthritis, left knee: Secondary | ICD-10-CM

## 2021-10-11 DIAGNOSIS — Z96652 Presence of left artificial knee joint: Secondary | ICD-10-CM

## 2021-10-11 DIAGNOSIS — M25762 Osteophyte, left knee: Secondary | ICD-10-CM | POA: Diagnosis not present

## 2021-10-11 DIAGNOSIS — Z87891 Personal history of nicotine dependence: Secondary | ICD-10-CM | POA: Insufficient documentation

## 2021-10-11 HISTORY — PX: TOTAL KNEE ARTHROPLASTY: SHX125

## 2021-10-11 SURGERY — ARTHROPLASTY, KNEE, TOTAL
Anesthesia: Monitor Anesthesia Care | Site: Knee | Laterality: Left

## 2021-10-11 MED ORDER — FENTANYL CITRATE (PF) 100 MCG/2ML IJ SOLN
INTRAMUSCULAR | Status: DC | PRN
  Administered 2021-10-11: 75 ug via INTRAVENOUS

## 2021-10-11 MED ORDER — OXYCODONE HCL 5 MG PO TABS
10.0000 mg | ORAL_TABLET | ORAL | Status: DC | PRN
  Administered 2021-10-11: 10 mg via ORAL
  Administered 2021-10-12 (×3): 15 mg via ORAL
  Filled 2021-10-11 (×5): qty 3

## 2021-10-11 MED ORDER — POLYETHYLENE GLYCOL 3350 17 G PO PACK: 17.0000 g | PACK | Freq: Every day | ORAL | Status: DC | PRN

## 2021-10-11 MED ORDER — METHOCARBAMOL 1000 MG/10ML IJ SOLN
500.0000 mg | Freq: Four times a day (QID) | INTRAVENOUS | Status: DC | PRN
  Filled 2021-10-11: qty 5

## 2021-10-11 MED ORDER — ONDANSETRON HCL 4 MG/2ML IJ SOLN
4.0000 mg | Freq: Four times a day (QID) | INTRAMUSCULAR | Status: DC | PRN
  Administered 2021-10-11: 4 mg via INTRAVENOUS
  Filled 2021-10-11: qty 2

## 2021-10-11 MED ORDER — BISACODYL 10 MG RE SUPP: 10.0000 mg | Freq: Every day | RECTAL | Status: DC | PRN

## 2021-10-11 MED ORDER — ACETAMINOPHEN 160 MG/5ML PO SOLN: 325.0000 mg | ORAL | Status: DC | PRN

## 2021-10-11 MED ORDER — LACTATED RINGERS IV SOLN: INTRAVENOUS | Status: DC

## 2021-10-11 MED ORDER — METHOCARBAMOL 500 MG PO TABS
500.0000 mg | ORAL_TABLET | Freq: Four times a day (QID) | ORAL | Status: DC | PRN
  Administered 2021-10-11 – 2021-10-12 (×3): 500 mg via ORAL
  Filled 2021-10-11 (×3): qty 1

## 2021-10-11 MED ORDER — METOCLOPRAMIDE HCL 5 MG/ML IJ SOLN: 5.0000 mg | Freq: Three times a day (TID) | INTRAMUSCULAR | Status: DC | PRN

## 2021-10-11 MED ORDER — DIPHENHYDRAMINE HCL 12.5 MG/5ML PO ELIX: 12.5000 mg | ORAL_SOLUTION | ORAL | Status: DC | PRN

## 2021-10-11 MED ORDER — ASPIRIN EC 325 MG PO TBEC
325.0000 mg | DELAYED_RELEASE_TABLET | Freq: Every day | ORAL | Status: DC
  Administered 2021-10-12: 325 mg via ORAL
  Filled 2021-10-11: qty 1

## 2021-10-11 MED ORDER — DOCUSATE SODIUM 100 MG PO CAPS
100.0000 mg | ORAL_CAPSULE | Freq: Two times a day (BID) | ORAL | Status: DC
  Administered 2021-10-11 – 2021-10-12 (×2): 100 mg via ORAL
  Filled 2021-10-11 (×2): qty 1

## 2021-10-11 MED ORDER — TRANEXAMIC ACID-NACL 1000-0.7 MG/100ML-% IV SOLN
1000.0000 mg | INTRAVENOUS | Status: AC
  Administered 2021-10-11: 1000 mg via INTRAVENOUS

## 2021-10-11 MED ORDER — ROPIVACAINE HCL 7.5 MG/ML IJ SOLN
INTRAMUSCULAR | Status: DC | PRN
  Administered 2021-10-11: 30 mL via PERINEURAL

## 2021-10-11 MED ORDER — MENTHOL 3 MG MT LOZG: 1.0000 | LOZENGE | OROMUCOSAL | Status: DC | PRN

## 2021-10-11 MED ORDER — CHLORHEXIDINE GLUCONATE 0.12 % MT SOLN: 15.0000 mL | Freq: Once | OROMUCOSAL | Status: AC

## 2021-10-11 MED ORDER — METOCLOPRAMIDE HCL 5 MG PO TABS: 5.0000 mg | ORAL_TABLET | Freq: Three times a day (TID) | ORAL | Status: DC | PRN

## 2021-10-11 MED ORDER — SODIUM CHLORIDE 0.9 % IR SOLN
Status: DC | PRN
  Administered 2021-10-11: 3000 mL

## 2021-10-11 MED ORDER — ZOLPIDEM TARTRATE 5 MG PO TABS: 5.0000 mg | ORAL_TABLET | Freq: Every evening | ORAL | Status: DC | PRN

## 2021-10-11 MED ORDER — CHLORHEXIDINE GLUCONATE 0.12 % MT SOLN
OROMUCOSAL | Status: AC
  Administered 2021-10-11: 15 mL via OROMUCOSAL
  Filled 2021-10-11: qty 15

## 2021-10-11 MED ORDER — ORAL CARE MOUTH RINSE: 15.0000 mL | Freq: Once | OROMUCOSAL | Status: AC

## 2021-10-11 MED ORDER — LEVOTHYROXINE SODIUM 112 MCG PO TABS
224.0000 ug | ORAL_TABLET | Freq: Every day | ORAL | Status: DC
  Administered 2021-10-12: 224 ug via ORAL
  Filled 2021-10-11: qty 2

## 2021-10-11 MED ORDER — EPHEDRINE SULFATE 50 MG/ML IJ SOLN
INTRAMUSCULAR | Status: DC | PRN
  Administered 2021-10-11 (×4): 5 mg via INTRAVENOUS

## 2021-10-11 MED ORDER — ONDANSETRON HCL 4 MG PO TABS: 4.0000 mg | ORAL_TABLET | Freq: Four times a day (QID) | ORAL | Status: DC | PRN

## 2021-10-11 MED ORDER — MIDAZOLAM HCL 2 MG/2ML IJ SOLN
INTRAMUSCULAR | Status: AC
  Filled 2021-10-11: qty 2

## 2021-10-11 MED ORDER — ACETAMINOPHEN 325 MG PO TABS: 325.0000 mg | ORAL_TABLET | ORAL | Status: DC | PRN

## 2021-10-11 MED ORDER — DEXMEDETOMIDINE (PRECEDEX) IN NS 20 MCG/5ML (4 MCG/ML) IV SYRINGE
PREFILLED_SYRINGE | INTRAVENOUS | Status: DC | PRN
  Administered 2021-10-11: 8 ug via INTRAVENOUS

## 2021-10-11 MED ORDER — LIDOCAINE 2% (20 MG/ML) 5 ML SYRINGE
INTRAMUSCULAR | Status: DC | PRN
  Administered 2021-10-11: 100 mg via INTRAVENOUS

## 2021-10-11 MED ORDER — ATORVASTATIN CALCIUM 10 MG PO TABS
10.0000 mg | ORAL_TABLET | Freq: Every evening | ORAL | Status: DC
  Administered 2021-10-11: 10 mg via ORAL
  Filled 2021-10-11: qty 1

## 2021-10-11 MED ORDER — PROPOFOL 500 MG/50ML IV EMUL
INTRAVENOUS | Status: DC | PRN
  Administered 2021-10-11: 100 ug/kg/min via INTRAVENOUS

## 2021-10-11 MED ORDER — PHENYLEPHRINE HCL (PRESSORS) 10 MG/ML IV SOLN
INTRAVENOUS | Status: DC | PRN
  Administered 2021-10-11: 40 ug via INTRAVENOUS
  Administered 2021-10-11 (×2): 80 ug via INTRAVENOUS

## 2021-10-11 MED ORDER — OXYCODONE HCL 5 MG/5ML PO SOLN: 5.0000 mg | Freq: Once | ORAL | Status: DC | PRN

## 2021-10-11 MED ORDER — FENTANYL CITRATE (PF) 100 MCG/2ML IJ SOLN
25.0000 ug | INTRAMUSCULAR | Status: DC | PRN
  Administered 2021-10-11: 50 ug via INTRAVENOUS

## 2021-10-11 MED ORDER — CEFAZOLIN SODIUM-DEXTROSE 1-4 GM/50ML-% IV SOLN
1.0000 g | Freq: Four times a day (QID) | INTRAVENOUS | Status: AC
  Administered 2021-10-11 (×2): 1 g via INTRAVENOUS
  Filled 2021-10-11 (×2): qty 50

## 2021-10-11 MED ORDER — CEFAZOLIN SODIUM-DEXTROSE 2-4 GM/100ML-% IV SOLN
INTRAVENOUS | Status: AC
  Filled 2021-10-11: qty 100

## 2021-10-11 MED ORDER — 0.9 % SODIUM CHLORIDE (POUR BTL) OPTIME
TOPICAL | Status: DC | PRN
  Administered 2021-10-11: 1000 mL

## 2021-10-11 MED ORDER — HYDROMORPHONE HCL 1 MG/ML IJ SOLN
0.5000 mg | INTRAMUSCULAR | Status: DC | PRN
  Administered 2021-10-11 – 2021-10-12 (×3): 1 mg via INTRAVENOUS
  Filled 2021-10-11 (×3): qty 1

## 2021-10-11 MED ORDER — SODIUM CHLORIDE 0.9 % IV SOLN: INTRAVENOUS | Status: DC

## 2021-10-11 MED ORDER — CEFAZOLIN SODIUM-DEXTROSE 2-4 GM/100ML-% IV SOLN
2.0000 g | INTRAVENOUS | Status: AC
  Administered 2021-10-11: 2 g via INTRAVENOUS

## 2021-10-11 MED ORDER — PHENOL 1.4 % MT LIQD: 1.0000 | OROMUCOSAL | Status: DC | PRN

## 2021-10-11 MED ORDER — MIDAZOLAM HCL 5 MG/5ML IJ SOLN
INTRAMUSCULAR | Status: DC | PRN
  Administered 2021-10-11: 2 mg via INTRAVENOUS

## 2021-10-11 MED ORDER — ACETAMINOPHEN 325 MG PO TABS
325.0000 mg | ORAL_TABLET | Freq: Four times a day (QID) | ORAL | Status: DC | PRN
  Administered 2021-10-12: 650 mg via ORAL
  Filled 2021-10-11: qty 2

## 2021-10-11 MED ORDER — FENTANYL CITRATE (PF) 100 MCG/2ML IJ SOLN
INTRAMUSCULAR | Status: AC
  Filled 2021-10-11: qty 2

## 2021-10-11 MED ORDER — TRANEXAMIC ACID-NACL 1000-0.7 MG/100ML-% IV SOLN
INTRAVENOUS | Status: AC
  Filled 2021-10-11: qty 100

## 2021-10-11 MED ORDER — ONDANSETRON HCL 4 MG/2ML IJ SOLN: 4.0000 mg | Freq: Once | INTRAMUSCULAR | Status: DC | PRN

## 2021-10-11 MED ORDER — OXYCODONE HCL 5 MG PO TABS: 5.0000 mg | ORAL_TABLET | Freq: Once | ORAL | Status: DC | PRN

## 2021-10-11 MED ORDER — LOSARTAN POTASSIUM 50 MG PO TABS
50.0000 mg | ORAL_TABLET | Freq: Every evening | ORAL | Status: DC
  Administered 2021-10-11: 50 mg via ORAL
  Filled 2021-10-11: qty 1

## 2021-10-11 MED ORDER — BUPIVACAINE IN DEXTROSE 0.75-8.25 % IT SOLN
INTRATHECAL | Status: DC | PRN
  Administered 2021-10-11: 1.8 mL via INTRATHECAL

## 2021-10-11 MED ORDER — FENTANYL CITRATE (PF) 250 MCG/5ML IJ SOLN
INTRAMUSCULAR | Status: AC
  Filled 2021-10-11: qty 5

## 2021-10-11 MED ORDER — OXYCODONE HCL 5 MG PO TABS: 5.0000 mg | ORAL_TABLET | ORAL | Status: DC | PRN

## 2021-10-11 MED ORDER — PROPOFOL 10 MG/ML IV BOLUS
INTRAVENOUS | Status: DC | PRN
Start: 2021-10-11 — End: 2021-10-11
  Administered 2021-10-11: 50 mg via INTRAVENOUS

## 2021-10-11 MED ORDER — MEPERIDINE HCL 25 MG/ML IJ SOLN: 6.2500 mg | INTRAMUSCULAR | Status: DC | PRN

## 2021-10-11 SURGICAL SUPPLY — 55 items
BAG COUNTER SPONGE SURGICOUNT (BAG) ×2 IMPLANT
BAG SPNG CNTER NS LX DISP (BAG) ×1
BLADE SAGITTAL 25.0X1.19X90 (BLADE) ×2 IMPLANT
BLADE SAW SGTL 13X75X1.27 (BLADE) ×2 IMPLANT
BLADE SURG 21 STRL SS (BLADE) ×4 IMPLANT
BNDG COHESIVE 6X5 TAN STRL LF (GAUZE/BANDAGES/DRESSINGS) ×4 IMPLANT
BNDG GAUZE ELAST 4 BULKY (GAUZE/BANDAGES/DRESSINGS) ×2 IMPLANT
BOWL SMART MIX CTS (DISPOSABLE) ×2 IMPLANT
BSPLAT TIB F 2 PG STRL KN LT (Stem) ×1 IMPLANT
COMP FEM PS 9 LT STD (Joint) ×2 IMPLANT
COMPONENT FEM PS 9 LT STD (Joint) IMPLANT
COOLER ICEMAN CLASSIC (MISCELLANEOUS) ×2 IMPLANT
COVER SURGICAL LIGHT HANDLE (MISCELLANEOUS) ×2 IMPLANT
CUFF TOURN SGL QUICK 34 (TOURNIQUET CUFF) ×2
CUFF TOURN SGL QUICK 42 (TOURNIQUET CUFF) IMPLANT
CUFF TRNQT CYL 34X4.125X (TOURNIQUET CUFF) ×1 IMPLANT
DRAPE EXTREMITY T 121X128X90 (DISPOSABLE) ×2 IMPLANT
DRAPE HALF SHEET 40X57 (DRAPES) ×4 IMPLANT
DRAPE U-SHAPE 47X51 STRL (DRAPES) ×2 IMPLANT
DRSG ADAPTIC 3X8 NADH LF (GAUZE/BANDAGES/DRESSINGS) ×2 IMPLANT
DRSG PAD ABDOMINAL 8X10 ST (GAUZE/BANDAGES/DRESSINGS) ×2 IMPLANT
DURAPREP 26ML APPLICATOR (WOUND CARE) ×2 IMPLANT
ELECT REM PT RETURN 9FT ADLT (ELECTROSURGICAL) ×2
ELECTRODE REM PT RTRN 9FT ADLT (ELECTROSURGICAL) ×1 IMPLANT
FACESHIELD WRAPAROUND (MASK) ×2 IMPLANT
FACESHIELD WRAPAROUND OR TEAM (MASK) ×1 IMPLANT
GAUZE SPONGE 4X4 12PLY STRL (GAUZE/BANDAGES/DRESSINGS) ×2 IMPLANT
GLOVE SURG ORTHO LTX SZ9 (GLOVE) ×2 IMPLANT
GLOVE SURG UNDER POLY LF SZ9 (GLOVE) ×2 IMPLANT
GOWN STRL REUS W/ TWL XL LVL3 (GOWN DISPOSABLE) ×2 IMPLANT
GOWN STRL REUS W/TWL XL LVL3 (GOWN DISPOSABLE) ×4
HANDPIECE INTERPULSE COAX TIP (DISPOSABLE) ×2
HDLS TROCR DRIL PIN KNEE 75 (PIN) ×2
IMPL PATELLA METAL SZ32X10 (Joint) ×1 IMPLANT
KIT BASIN OR (CUSTOM PROCEDURE TRAY) ×2 IMPLANT
KIT TURNOVER KIT B (KITS) ×2 IMPLANT
MANIFOLD NEPTUNE II (INSTRUMENTS) ×2 IMPLANT
NS IRRIG 1000ML POUR BTL (IV SOLUTION) ×2 IMPLANT
PACK TOTAL JOINT (CUSTOM PROCEDURE TRAY) ×2 IMPLANT
PAD ARMBOARD 7.5X6 YLW CONV (MISCELLANEOUS) ×2 IMPLANT
PAD COLD SHLDR WRAP-ON (PAD) ×2 IMPLANT
PIN DRILL HDLS TROCAR 75 4PK (PIN) IMPLANT
SCREW FEMALE HEX FIX 25X2.5 (ORTHOPEDIC DISPOSABLE SUPPLIES) ×1 IMPLANT
SET HNDPC FAN SPRY TIP SCT (DISPOSABLE) ×1 IMPLANT
STAPLER VISISTAT 35W (STAPLE) ×2 IMPLANT
STEM ARTISURF EF 12 SZ8-11 (Stem) ×1 IMPLANT
STEM TIBIAL TRAB SZF LT (Stem) ×1 IMPLANT
SUCTION FRAZIER HANDLE 10FR (MISCELLANEOUS)
SUCTION TUBE FRAZIER 10FR DISP (MISCELLANEOUS) IMPLANT
SUT VIC AB 0 CT1 27 (SUTURE) ×2
SUT VIC AB 0 CT1 27XBRD ANBCTR (SUTURE) ×1 IMPLANT
SUT VIC AB 1 CTX 36 (SUTURE)
SUT VIC AB 1 CTX36XBRD ANBCTR (SUTURE) IMPLANT
TOWEL GREEN STERILE (TOWEL DISPOSABLE) ×2 IMPLANT
TOWEL GREEN STERILE FF (TOWEL DISPOSABLE) ×2 IMPLANT

## 2021-10-11 NOTE — H&P (Signed)
TOTAL KNEE ADMISSION H&P  Patient is being admitted for left total knee arthroplasty.  Subjective:  Chief Complaint:left knee pain.  HPI: Tyler [name], 56 y.o. male, has a history of pain and functional disability in the left knee due to arthritis and has failed non-surgical conservative treatments for greater than 12 weeks to includeNSAID's and/or analgesics, corticosteriod injections, flexibility and strengthening excercises, use of assistive devices, weight reduction as appropriate, and activity modification.  Onset of symptoms was gradual, starting 8 years ago with gradually worsening course since that time. The patient noted no past surgery on the left knee(s).  Patient currently rates pain in the left knee(s) at 8 out of 10 with activity. Patient has night pain, worsening of pain with activity and weight bearing, pain that interferes with activities of daily living, pain with passive range of motion, crepitus, and joint swelling.  Patient has evidence of subchondral cysts, subchondral sclerosis, periarticular osteophytes, joint subluxation, and joint space narrowing by imaging studies. This patient has had avascular necrosis of the knee. There is no active infection.  Patient Active Problem List   Diagnosis Date Noted   Arthritis of right knee 08/17/2019   Past Medical History:  Diagnosis Date   Arthritis    COVID 2020   no symptoms   Hypertension    Hypothyroid    Pneumonia     Past Surgical History:  Procedure Laterality Date   HAND TENDON SURGERY Left    QUADRICEPS TENDON REPAIR Left 06/13/2015   Procedure: REPAIR LEFT QUADRICEP TENDON;  Surgeon: Newt Minion, MD;  Location: [place];  Service: Orthopedics;  Laterality: Left;   TOTAL KNEE ARTHROPLASTY Right 08/17/2019   TOTAL KNEE ARTHROPLASTY Right 08/17/2019   Procedure: RIGHT TOTAL KNEE ARTHROPLASTY;  Surgeon: Newt Minion, MD;  Location: [place];  Service: Orthopedics;  Laterality: Right;   WISDOM TOOTH EXTRACTION      2    Current Facility-Administered Medications  Medication Dose Route Frequency Provider Last Rate Last Admin   ceFAZolin (ANCEF) 2-4 GM/100ML-% IVPB            ceFAZolin (ANCEF) IVPB 2g/100 mL premix  2 g Intravenous On Call to OR Newt Minion, MD       lactated ringers infusion   Intravenous Continuous Belinda Block, MD       tranexamic acid (CYKLOKAPRON) 1000MG /128mL IVPB            tranexamic acid (CYKLOKAPRON) 2,000 mg in sodium chloride 0.9 % 50 mL Topical Application  123XX123 mg Topical To OR Newt Minion, MD       tranexamic acid (CYKLOKAPRON) IVPB 1,000 mg  1,000 mg Intravenous To OR Newt Minion, MD       No Known Allergies  Social History   Tobacco Use   Smoking status: Former    Types: Cigarettes    Quit date: 06/12/1995    Years since quitting: 26.3   Smokeless tobacco: Never  Substance Use Topics   Alcohol use: Yes    Comment: 6 drinks/week    Family History  Problem Relation Age of Onset   Heart attack Father      Review of Systems  All other systems reviewed and are negative.  Objective:  Physical Exam  Vital signs in last 24 hours: Temp:  [98 F (36.7 C)] 98 F (36.7 C) (01/06 0605) Pulse Rate:  [63-66] 66 (01/06 0621) Resp:  [18] 18 (01/06 0605) BP: (153-159)/(90-101) 159/90 (01/06 0621) SpO2:  [95 %]  95 % (01/06 0605) Weight:  [99.8 kg] 99.8 kg (01/06 0605)  Labs:   Estimated body mass index is 33.45 kg/m as calculated from the following:   Height as of this encounter: 5\' 8"  (1.727 m).   Weight as of this encounter: 99.8 kg.   Imaging Review Plain radiographs demonstrate moderate degenerative joint disease of the left knee(s). The overall alignment ismild varus. The bone quality appears to be adequate for age and reported activity level.      Assessment/Plan:  End stage arthritis, left knee   The patient history, physical examination, clinical judgment of the provider and imaging studies are consistent with end stage  degenerative joint disease of the left knee(s) and total knee arthroplasty is deemed medically necessary. The treatment options including medical management, injection therapy arthroscopy and arthroplasty were discussed at length. The risks and benefits of total knee arthroplasty were presented and reviewed. The risks due to aseptic loosening, infection, stiffness, patella tracking problems, thromboembolic complications and other imponderables were discussed. The patient acknowledged the explanation, agreed to proceed with the plan and consent was signed. Patient is being admitted for inpatient treatment for surgery, pain control, PT, OT, prophylactic antibiotics, VTE prophylaxis, progressive ambulation and ADL's and discharge planning. The patient is planning to be discharged home with home health services     Patient's anticipated LOS is less than 2 midnights, meeting these requirements: - Younger than 39 - Lives within 1 hour of care - Has a competent adult at home to recover with post-op recover - NO history of  - Chronic pain requiring opiods  - Diabetes  - Coronary Artery Disease  - Heart failure  - Heart attack  - Stroke  - DVT/VTE  - Cardiac arrhythmia  - Respiratory Failure/COPD  - Renal failure  - Anemia  - Advanced Liver disease

## 2021-10-11 NOTE — Anesthesia Postprocedure Evaluation (Signed)
Anesthesia Post Note  Patient: Eveline Keto  Procedure(s) Performed: Left TOTAL KNEE ARTHROPLASTY (Left: Knee)     Patient location during evaluation: PACU Anesthesia Type: Regional and Spinal Level of consciousness: awake and alert Pain management: pain level controlled Vital Signs Assessment: post-procedure vital signs reviewed and stable Respiratory status: spontaneous breathing, nonlabored ventilation, respiratory function stable and patient connected to nasal cannula oxygen Cardiovascular status: blood pressure returned to baseline and stable Postop Assessment: no apparent nausea or vomiting Anesthetic complications: no   No notable events documented.  Last Vitals:  Vitals:   10/11/21 1000 10/11/21 1015  BP: 105/69 100/68  Pulse: 62 (!) 59  Resp: 19 19  Temp:    SpO2: 100% 100%    Last Pain:  Vitals:   10/11/21 1015  TempSrc:   PainSc: 0-No pain                 Josely Moffat

## 2021-10-11 NOTE — Transfer of Care (Signed)
Immediate Anesthesia Transfer of Care Note  Patient: Tyler [name]  Procedure(s) Performed: Left TOTAL KNEE ARTHROPLASTY (Left: Knee)  Patient Location: PACU  Anesthesia Type:MAC  Level of Consciousness: awake and patient cooperative  Airway & Oxygen Therapy: Patient Spontanous Breathing  Post-op Assessment: Report given to RN and Post -op Vital signs reviewed and stable  Post vital signs: Reviewed and stable  Last Vitals:  Vitals Value Taken Time  BP 82/58 10/11/21 0914  Temp    Pulse 80 10/11/21 0915  Resp 35 10/11/21 0915  SpO2 98 % 10/11/21 0915  Vitals shown include unvalidated device data.  Last Pain:  Vitals:   10/11/21 0615  TempSrc:   PainSc: 0-No pain         Complications: No notable events documented.

## 2021-10-11 NOTE — Evaluation (Signed)
Physical Therapy Evaluation Patient Details Name: Tyler [name] MRN: [id number] DOB: 1965-12-10 Today's Date: 10/11/2021  History of Present Illness  56 y.o. male presents to [facility] hospital on 10/11/2021 for L TKA. PMH includes OA, COVID, HTN, PNA.  Clinical Impression  Pt presents to PT with deficits in ROM, strength, power, endurance, gait, functional mobility, balance. Pt is limited somewhat by nausea and vomiting during session. Pt demonstrates the ability to perform bed mobility and transfer with limited assistance. PT anticipates the pt will progress quickly once pain and nausea are better managed.       Recommendations for follow up therapy are one component of a multi-disciplinary discharge planning process, led by the attending physician.  Recommendations may be updated based on patient status, additional functional criteria and insurance authorization.  Follow Up Recommendations Follow physician's recommendations for discharge plan and follow up therapies    Assistance Recommended at Discharge Intermittent Supervision/Assistance  Patient can return home with the following  A little help with walking and/or transfers    Equipment Recommendations Rolling walker (2 wheels);BSC/3in1  Recommendations for Other Services       Functional Status Assessment Patient has had a recent decline in their functional status and demonstrates the ability to make significant improvements in function in a reasonable and predictable amount of time.     Precautions / Restrictions Precautions Precautions: Fall;Knee Precaution Booklet Issued: No Restrictions Weight Bearing Restrictions: No Other Position/Activity Restrictions: no WB precautions listed in orders      Mobility  Bed Mobility Overal bed mobility: Needs Assistance Bed Mobility: Supine to Sit;Sit to Supine     Supine to sit: Supervision Sit to supine: Min assist   General bed mobility comments: assist for LLE     Transfers Overall transfer level: Needs assistance Equipment used: Rolling walker (2 wheels) Transfers: Sit to/from Stand Sit to Stand: Min guard           General transfer comment: cues for hand placement    Ambulation/Gait Ambulation/Gait assistance: Min guard Gait Distance (Feet): 4 Feet (4' x 2) Assistive device: Rolling walker (2 wheels) Gait Pattern/deviations: Step-to pattern Gait velocity: reduced Gait velocity interpretation: <1.31 ft/sec, indicative of household ambulator   General Gait Details: pt with slowed step-to gait, reduced stance time on LLE  Stairs            Wheelchair Mobility    Modified Rankin (Stroke Patients Only)       Balance Overall balance assessment: Needs assistance Sitting-balance support: No upper extremity supported;Feet supported Sitting balance-Leahy Scale: Good     Standing balance support: Bilateral upper extremity supported;Reliant on assistive device for balance Standing balance-Leahy Scale: Poor                               Pertinent Vitals/Pain Pain Assessment: 0-10 Pain Score: 5  Breathing: occasional labored breathing, short period of hyperventilation Pain Location: L knee Pain Descriptors / Indicators: Aching Pain Intervention(s): Monitored during session    Home Living Family/patient expects to be discharged to:: Private residence Living Arrangements: Spouse/significant other Available Help at Discharge: Family;Available 24 hours/day Type of Home: House Home Access: Level entry       Home Layout: One level Home Equipment: Cane - single point      Prior Function Prior Level of Function : Independent/Modified Independent;Working/employed;Driving  Hand Dominance        Extremity/Trunk Assessment   Upper Extremity Assessment Upper Extremity Assessment: Overall WFL for tasks assessed    Lower Extremity Assessment Lower Extremity Assessment: LLE  deficits/detail LLE Deficits / Details: ankle 4-/5 PF/DF, 3-/5 LAQ sitting at edge of bed, 2/5 hip adduction. ROM deferred at this time LLE Sensation: WNL    Cervical / Trunk Assessment Cervical / Trunk Assessment: Normal  Communication   Communication: No difficulties  Cognition Arousal/Alertness: Awake/alert Behavior During Therapy: WFL for tasks assessed/performed Overall Cognitive Status: Within Functional Limits for tasks assessed                                          General Comments General comments (skin integrity, edema, etc.): VSS on RA. Pt with nausea and vomiting during session    Exercises     Assessment/Plan    PT Assessment Patient needs continued PT services  PT Problem List Decreased strength;Decreased range of motion;Decreased activity tolerance;Decreased balance;Decreased mobility;Decreased knowledge of use of DME;Pain       PT Treatment Interventions DME instruction;Gait training;Functional mobility training;Therapeutic activities;Therapeutic exercise;Balance training;Neuromuscular re-education;Patient/family education    PT Goals (Current goals can be found in the Care Plan section)  Acute Rehab PT Goals Patient Stated Goal: to go home and return to independence PT Goal Formulation: With patient Time For Goal Achievement: 10/15/21 Potential to Achieve Goals: Good    Frequency 7X/week     Co-evaluation               AM-PAC PT "6 Clicks" Mobility  Outcome Measure Help needed turning from your back to your side while in a flat bed without using bedrails?: A Little Help needed moving from lying on your back to sitting on the side of a flat bed without using bedrails?: A Little Help needed moving to and from a bed to a chair (including a wheelchair)?: A Little Help needed standing up from a chair using your arms (e.g., wheelchair or bedside chair)?: A Little Help needed to walk in hospital room?: Total Help needed climbing  3-5 steps with a railing? : Total 6 Click Score: 14    End of Session   Activity Tolerance: Treatment limited secondary to medical complications (Comment) (nausea, vomiting) Patient left: in bed;with call bell/phone within reach;with bed alarm set Nurse Communication: Mobility status PT Visit Diagnosis: Other abnormalities of gait and mobility (R26.89);Muscle weakness (generalized) (M62.81);Pain Pain - Right/Left: Left Pain - part of body: Knee    Time: 7262-0355 PT Time Calculation (min) (ACUTE ONLY): 31 min   Charges:   PT Evaluation $PT Eval Low Complexity: 1 Low        Arlyss Gandy, PT, DPT Acute Rehabilitation Pager: [phone] Office [phone]   Arlyss Gandy 10/11/2021, 5:08 PM

## 2021-10-11 NOTE — Anesthesia Procedure Notes (Signed)
Spinal  Patient location during procedure: OR Start time: 10/11/2021 7:42 AM End time: 10/11/2021 7:47 AM Reason for block: surgical anesthesia Staffing Anesthesiologist: Bethena Midget, MD Preanesthetic Checklist Completed: patient identified, IV checked, site marked, risks and benefits discussed, surgical consent, monitors and equipment checked, pre-op evaluation and timeout performed Spinal Block Patient position: sitting Prep: DuraPrep Patient monitoring: heart rate, cardiac monitor, continuous pulse ox and blood pressure Approach: midline Location: L3-4 Injection technique: single-shot Needle Needle type: Sprotte  Needle gauge: 24 G Needle length: 9 cm Assessment Sensory level: T4 Events: CSF return Additional Notes Osso x 2, reposition, CSF /DOSE/ CSF asp

## 2021-10-11 NOTE — Plan of Care (Signed)

## 2021-10-11 NOTE — Op Note (Signed)
DATE OF SURGERY:  10/11/2021  TIME: 9:23 AM  PATIENT NAME:  Tyler [name]    AGE: 56 y.o.    PRE-OPERATIVE DIAGNOSIS:  Left Knee Osteoarthritis  POST-OPERATIVE DIAGNOSIS:  Left Knee Osteoarthritis  PROCEDURE:  Procedure(s): Left TOTAL KNEE ARTHROPLASTY  SURGEON: Meridee Score  ASSISTANT: April green  OPERATIVE IMPLANTS: Zimmer Persona  Femur size 9, Tibia size F  2- Peg fixed bearing, Patella size 32 1-peg oval button, with a 12 mm polyethylene insert medial congruent.  @ENCIMAGES @     PREOPERATIVE INDICATIONS:   Tyler [name] is a 56 y.o. year old male with end stage degenerative arthritis of the knee who failed conservative treatment and elected for Total Knee Arthroplasty.   The risks, benefits, and alternatives were discussed at length including but not limited to the risks of infection, bleeding, nerve injury, stiffness, blood clots, the need for revision surgery, cardiopulmonary complications, among others, and they were willing to proceed.  OPERATIVE DESCRIPTION:  The patient was brought to the operative room and placed in a supine position.  Anesthesia was administered.  IV antibiotics were given.  IV TXA was initiated.  The lower extremity was prepped and draped in the usual sterile fashion.  Tyler [name] was used to cover all exposed skin. Time out was performed.    Anterior quadriceps tendon splitting approach was performed.  The patella was everted and osteophytes were removed.  The anterior horn of the medial and lateral meniscus was removed.   The distal femur was opened with the drill and the intramedullary distal femoral cutting jig was utilized, set at 5 degrees valgus resecting 9 mm off the distal femur.  Care was taken to protect the collateral ligaments.  Then the extramedullary tibial cutting jig was utilized set for 3 degree posterior slope.  Care was taken during the cut to protect the medial and collateral ligaments.  The proximal tibia was removed  along with the posterior horns of the menisci.  The PCL was sacrificed.    The extensor gap was measured and was approximately 12 mm.    The distal femoral sizing jig was applied, taking care to avoid notching.  Then the 4-in-1 cutting jig was applied and the anterior and posterior femur was cut, along with the chamfer cuts.  All posterior osteophytes were removed.  The flexion gap was then measured and was symmetric with the extension gap.  The distal femoral preparation using the appropriate jig to prepare the box.  The patella was then measured, and cut with the saw.    The proximal tibia sized and prepared accordingly with the reamer and the punch, and then all components were trialed with the poly insert.  The knee was found to have stable balance and full motion.  The knee was irrigated with normal saline, topical 2 g TXA was used to soak the wound.  The above named components were then press fit into place.  The final polyethylene component was placed.  The knee was then taken through a range of motion and the patella tracked well and the knee irrigated copiously and the parapatellar and subcutaneous tissue closed with vicryl, and skin closed with staples..  A sterile dressing was applied and patient  was taken to the PACU in stable  condition.  There were no complications.  Total tourniquet time was 0 minutes.

## 2021-10-11 NOTE — Anesthesia Procedure Notes (Signed)
Anesthesia Regional Block: Adductor canal block   Pre-Anesthetic Checklist: , timeout performed,  Correct Patient, Correct Site, Correct Laterality,  Correct Procedure, Correct Position, site marked,  Risks and benefits discussed,  Surgical consent,  Pre-op evaluation,  At surgeon's request and post-op pain management  Laterality: Left  Prep: chloraprep       Needles:  Injection technique: Single-shot  Needle Type: Echogenic Stimulator Needle     Needle Length: 5cm  Needle Gauge: 22     Additional Needles:   Procedures:, nerve stimulator,,, ultrasound used (permanent image in chart),,     Nerve Stimulator or Paresthesia:  Response: quadraceps contraction, 0.45 mA  Additional Responses:   Narrative:  Start time: 10/11/2021 7:00 AM End time: 10/11/2021 7:10 AM Injection made incrementally with aspirations every 5 mL.  Performed by: Personally  Anesthesiologist: Janeece Riggers, MD  Additional Notes: Functioning IV was confirmed and monitors were applied.  A 83mm 22ga Arrow echogenic stimulator needle was used. Sterile prep and drape,hand hygiene and sterile gloves were used. Ultrasound guidance: relevant anatomy identified, needle position confirmed, local anesthetic spread visualized around nerve(s)., vascular puncture avoided.  Image printed for medical record. Negative aspiration and negative test dose prior to incremental administration of local anesthetic. The patient tolerated the procedure well.

## 2021-10-12 DIAGNOSIS — M1712 Unilateral primary osteoarthritis, left knee: Secondary | ICD-10-CM | POA: Diagnosis not present

## 2021-10-12 MED ORDER — ASPIRIN 325 MG PO TBEC
325.0000 mg | DELAYED_RELEASE_TABLET | Freq: Every day | ORAL | 0 refills | Status: AC
Start: 2021-10-13 — End: ?

## 2021-10-12 MED ORDER — OXYCODONE-ACETAMINOPHEN 5-325 MG PO TABS: 1.0000 | ORAL_TABLET | ORAL | 0 refills | Status: DC | PRN

## 2021-10-12 NOTE — Discharge Instructions (Signed)

## 2021-10-12 NOTE — Progress Notes (Addendum)
Physical Therapy Treatment Patient Details Name: Tyler [name] MRN: [id number] DOB: Mar 07, 1966 Today's Date: 10/12/2021   History of Present Illness Pt is a 56 y.o. male admitted 10/11/2021 for elective  L TKA. PMH includes OA, COVID, HTN, PNA.   PT Comments    Pt tolerated additional transfer and gait training with RW, as well as stair training with supervision for safety. Wife present for session. Reviewed educ re: precautions, positioning (resting in extension), edema control, therex, ADL strategies, fall risk reduction, DME use. Pt and wife report no further questions or concerns. Pt preparing for d/c home this afternoon.   Recommendations for follow up therapy are one component of a multi-disciplinary discharge planning process, led by the attending physician.  Recommendations may be updated based on patient status, additional functional criteria and insurance authorization.  Follow Up Recommendations  Follow physician's recommendations for discharge plan and follow up therapies     Assistance Recommended at Discharge Intermittent Supervision/Assistance  Patient can return home with the following Assist for transportation;Assistance with cooking/housework;A little help with bathing/dressing/bathroom   Equipment Recommendations  Rolling walker (2 wheels);BSC/3in1    Recommendations for Other Services       Precautions / Restrictions Precautions Precautions: Fall;Knee     Mobility  Bed Mobility Overal bed mobility: Needs Assistance Bed Mobility: Supine to Sit     Supine to sit: Min assist     General bed mobility comments: MinA for LLE management, otherwise pt performing well; cues for breathing; performed with bed flat without use of rails    Transfers Overall transfer level: Needs assistance Equipment used: Rolling walker (2 wheels) Transfers: Sit to/from Stand Sit to Stand: Supervision           General transfer comment: good hand placement without  cues    Ambulation/Gait Ambulation/Gait assistance: Supervision Gait Distance (Feet): 48 Feet Assistive device: Rolling walker (2 wheels) Gait Pattern/deviations: Step-through pattern;Decreased stride length;Decreased weight shift to left;Antalgic Gait velocity: Decreased     General Gait Details: Slow, antalgic gait with RW and supervision for safety; improving gait pattern with intermittent cues; cues for increased WBAT through LLE   Stairs Stairs: Yes Stairs assistance: Supervision Stair Management: No rails;Backwards;Forwards;With walker Number of Stairs: 1 General stair comments: Ascend/descended 1 threshold step (to recreate home set-up) with RW and supervision, cues for sequencing; wife present for education   Wheelchair Mobility    Modified Rankin (Stroke Patients Only)       Balance Overall balance assessment: Needs assistance Sitting-balance support: No upper extremity supported;Feet supported Sitting balance-Leahy Scale: Good     Standing balance support: Bilateral upper extremity supported;Reliant on assistive device for balance;No upper extremity supported Standing balance-Leahy Scale: Fair Standing balance comment: can static stand without UE support, encouraged at least single UE support for majority of standing tasks now due to fall risk                            Cognition Arousal/Alertness: Awake/alert Behavior During Therapy: WFL for tasks assessed/performed Overall Cognitive Status: Within Functional Limits for tasks assessed                                          Exercises Total Joint Exercises Long Arc Quad: AROM;Left;Seated Knee Flexion: AAROM;Left;Seated (with washcloth under foot on floor)     General Comments General comments (skin  integrity, edema, etc.): Pt's wife present and supportive; pt preparing for d/c today, reports no further questions or concerns. Still has strap from prior R TKA to use for heel  slides and other therex. Educ on strategies for LB dressing, reviewed L knee precautions/positioning      Pertinent Vitals/Pain Pain Assessment: Faces Faces Pain Scale: Hurts little more Pain Location: L knee Pain Descriptors / Indicators: Discomfort;Grimacing;Guarding Pain Intervention(s): Monitored during session;Limited activity within patient's tolerance    Home Living                          Prior Function            PT Goals (current goals can now be found in the care plan section) Progress towards PT goals: Progressing toward goals    Frequency    7X/week      PT Plan Current plan remains appropriate    Co-evaluation              AM-PAC PT "6 Clicks" Mobility   Outcome Measure  Help needed turning from your back to your side while in a flat bed without using bedrails?: A Little Help needed moving from lying on your back to sitting on the side of a flat bed without using bedrails?: A Little Help needed moving to and from a bed to a chair (including a wheelchair)?: A Little Help needed standing up from a chair using your arms (e.g., wheelchair or bedside chair)?: A Little Help needed to walk in hospital room?: A Little Help needed climbing 3-5 steps with a railing? : A Little 6 Click Score: 18    End of Session Equipment Utilized During Treatment: Gait belt Activity Tolerance: Patient tolerated treatment well Patient left: in chair;with call bell/phone within reach;with family/visitor present Nurse Communication: Mobility status;Other (comment) (pt can get up with wife supervision) PT Visit Diagnosis: Other abnormalities of gait and mobility (R26.89);Muscle weakness (generalized) (M62.81);Pain Pain - Right/Left: Left Pain - part of body: Knee     Time: 1039-1101 PT Time Calculation (min) (ACUTE ONLY): 22 min  Charges:  $Gait Training: 8-22 mins                    Ina Homes, PT, DPT Acute Rehabilitation Services  Pager  [phone] Office [phone]   Malachy Chamber 10/12/2021, 11:41 AM

## 2021-10-12 NOTE — TOC Transition Note (Addendum)
Transition of Care [facility]) - CM/SW Discharge Note   Patient Details  Name: Tyler [name] MRN: [id number] Date of Birth: [birth year]  [facility] CM/SW Contact:  Bess Kinds, RN Phone Number: [phone] 10/12/2021, 12:23 PM   Clinical Narrative:     Spoke with patient on hospital room phone. Discussed recommendations for HH PT and DME RW, 3N1. Patient agreeable. Referral to [facility] for DME. Referral to [facility] for [facility] PT. No further TOC needs identified at this time.   Update: Received call from [employer]. Patient received DME in 2020 and is not eligible through insurance at this time. Spoke with patient on hospital room phone - he is agreeable to private pay. [employer] aware and will deliver to the room.   Final next level of care: Home w Home Health Services Barriers to Discharge: No Barriers Identified   Patient Goals and CMS Choice Patient states their goals for this hospitalization and ongoing recovery are:: return home CMS Medicare.gov Compare Post Acute Care list provided to:: Patient Choice offered to / list presented to : Patient  Discharge Placement                       Discharge Plan and Services                DME Arranged: 3-N-1, Walker rolling DME Agency: [facility] Date DME Agency Contacted: 10/12/21 Time DME Agency Contacted: 1222 Representative spoke with at DME Agency: Leavy Cella HH Arranged: PT HH Agency: [facility] Date [facility] Agency Contacted: 10/12/21 Time HH Agency Contacted: 1222 Representative spoke with at [facility] Agency: Laurelyn Sickle  Social Determinants of Health (SDOH) Interventions     Readmission Risk Interventions No flowsheet data found.

## 2021-10-12 NOTE — Discharge Summary (Signed)
Discharge Diagnoses:  Principal Problem:   Total knee replacement status, left Active Problems:   Primary osteoarthritis of one knee, left   Surgeries: Procedure(s): Left TOTAL KNEE ARTHROPLASTY on 10/11/2021    Consultants:   Discharged Condition: Improved  Hospital Course: Tyler [name] is an 56 y.o. male who was admitted 10/11/2021 with a chief complaint of osteoarthritis left knee, with a final diagnosis of Left Knee Osteoarthritis.  Patient was brought to the operating room on 10/11/2021 and underwent Procedure(s): Left TOTAL KNEE ARTHROPLASTY.    Patient was given perioperative antibiotics:  Anti-infectives (From admission, onward)    Start     Dose/Rate Route Frequency Ordered Stop   10/11/21 1600  ceFAZolin (ANCEF) IVPB 1 g/50 mL premix        1 g 100 mL/hr over 30 Minutes Intravenous Every 6 hours 10/11/21 1334 10/11/21 2147   10/11/21 0615  ceFAZolin (ANCEF) IVPB 2g/100 mL premix        2 g 200 mL/hr over 30 Minutes Intravenous On call to O.R. 10/11/21 0604 10/11/21 0752   10/11/21 0611  ceFAZolin (ANCEF) 2-4 GM/100ML-% IVPB       Note to Pharmacy: Lurena Nida: cabinet override      10/11/21 0611 10/11/21 1814     .  Patient was given sequential compression devices, early ambulation, and aspirin for DVT prophylaxis.  Recent vital signs: Patient Vitals for the past 24 hrs:  BP Temp Temp src Pulse Resp SpO2  10/12/21 0637 118/80 98.2 F (36.8 C) -- 86 15 93 %  10/11/21 2127 118/79 98.6 F (37 C) Oral 93 19 --  10/11/21 1355 119/87 97.6 F (36.4 C) -- 74 15 98 %  10/11/21 1300 105/83 -- -- 74 (!) 21 100 %  10/11/21 1230 107/82 -- -- 63 18 99 %  10/11/21 1200 106/83 -- -- (!) 59 19 100 %  10/11/21 1130 105/80 (!) 97.5 F (36.4 C) -- (!) 55 18 100 %  10/11/21 1115 106/71 -- -- (!) 57 18 100 %  10/11/21 1100 99/77 -- -- 60 17 100 %  10/11/21 1045 106/71 -- -- (!) 52 13 100 %  10/11/21 1015 100/68 -- -- (!) 59 19 100 %  10/11/21 1000 105/69 -- -- 62 19 100 %   10/11/21 0945 100/70 -- -- 74 15 98 %  10/11/21 0930 95/68 -- -- 74 17 100 %  10/11/21 0915 95/67 (!) 97.2 F (36.2 C) -- 80 14 98 %  .  Recent laboratory studies: No results found.  Discharge Medications:   Allergies as of 10/12/2021   No Known Allergies      Medication List     TAKE these medications    aspirin 325 MG EC tablet Take 1 tablet (325 mg total) by mouth daily with breakfast. Start taking on: October 13, 2021 What changed:  how much to take when to take this reasons to take this   atorvastatin 10 MG tablet Commonly known as: LIPITOR Take 10 mg by mouth every evening.   levothyroxine 112 MCG tablet Commonly known as: SYNTHROID Take 224 mcg by mouth in the morning.   losartan 50 MG tablet Commonly known as: COZAAR Take 50 mg by mouth every evening.   oxyCODONE-acetaminophen 5-325 MG tablet Commonly known as: PERCOCET/ROXICET Take 1 tablet by mouth every 4 (four) hours as needed.        Diagnostic Studies: No results found.  Patient benefited maximally from their hospital stay and there were no  complications.     Disposition: Discharge disposition: 01-Home or Self Care      Discharge Instructions     Call MD / Call 911   Complete by: As directed    If you experience chest pain or shortness of breath, CALL 911 and be transported to the hospital emergency room.  If you develope a fever above 101 F, pus (white drainage) or increased drainage or redness at the wound, or calf pain, call your surgeon's office.   Constipation Prevention   Complete by: As directed    Drink plenty of fluids.  Prune juice may be helpful.  You may use a stool softener, such as Colace (over the counter) 100 mg twice a day.  Use MiraLax (over the counter) for constipation as needed.   Diet - low sodium heart healthy   Complete by: As directed    Increase activity slowly as tolerated   Complete by: As directed    Post-operative opioid taper instructions:   Complete by:  As directed    POST-OPERATIVE OPIOID TAPER INSTRUCTIONS: It is important to wean off of your opioid medication as soon as possible. If you do not need pain medication after your surgery it is ok to stop day one. Opioids include: Codeine, Hydrocodone(Norco, Vicodin), Oxycodone(Percocet, oxycontin) and hydromorphone amongst others.  Long term and even short term use of opiods can cause: Increased pain response Dependence Constipation Depression Respiratory depression And more.  Withdrawal symptoms can include Flu like symptoms Nausea, vomiting And more Techniques to manage these symptoms Hydrate well Eat regular healthy meals Stay active Use relaxation techniques(deep breathing, meditating, yoga) Do Not substitute Alcohol to help with tapering If you have been on opioids for less than two weeks and do not have pain than it is ok to stop all together.  Plan to wean off of opioids This plan should start within one week post op of your joint replacement. Maintain the same interval or time between taking each dose and first decrease the dose.  Cut the total daily intake of opioids by one tablet each day Next start to increase the time between doses. The last dose that should be eliminated is the evening dose.          Follow-up Information     Nadara Mustard, MD Follow up in 1 week(s).   Specialty: Orthopedic Surgery Contact information: [address] [place] [address] [phone]                  Signed: Nadara Mustard 10/12/2021, 8:35 AM

## 2021-10-12 NOTE — Progress Notes (Signed)
Patient ID: Tyler [name], male   DOB: January 12, 1966, 56 y.o.   MRN: [id number] Patient has no complaints this morning.  Discussed the importance of working on knee extension.  Patient will plan for discharge to home today after therapy.  He will take the [facility] machine with him.

## 2021-10-12 NOTE — Progress Notes (Addendum)
Physical Therapy Treatment Patient Details Name: Tyler [name] MRN: [id number] DOB: July 17, 1966 Today's Date: 10/12/2021   History of Present Illness Pt is a 56 y.o. male admitted 10/11/2021 for elective  L TKA. PMH includes OA, COVID, HTN, PNA.   PT Comments    Pt progressing well with mobility. Today's session focused on transfer and gait training with RW, pt moving well with intermittent min guard for balance. Expect pt to be ready for d/c home from a mobility perspective after PM session for stair training.     Recommendations for follow up therapy are one component of a multi-disciplinary discharge planning process, led by the attending physician.  Recommendations may be updated based on patient status, additional functional criteria and insurance authorization.  Follow Up Recommendations  Follow physician's recommendations for discharge plan and follow up therapies     Assistance Recommended at Discharge Intermittent Supervision/Assistance  Patient can return home with the following Assist for transportation;Assistance with cooking/housework;A little help with bathing/dressing/bathroom   Equipment Recommendations  Rolling walker (2 wheels);BSC/3in1    Recommendations for Other Services       Precautions / Restrictions Precautions Precautions: Fall;Knee     Mobility  Bed Mobility Overal bed mobility: Needs Assistance Bed Mobility: Supine to Sit     Supine to sit: Min assist     General bed mobility comments: MinA for LLE management, otherwise pt performing well; cues for breathing    Transfers Overall transfer level: Needs assistance Equipment used: Rolling walker (2 wheels) Transfers: Sit to/from Stand Sit to Stand: Min guard           General transfer comment: Able to stand from EOB and BSC (over toilet) to RW, cues for sequencing and hand placement    Ambulation/Gait Ambulation/Gait assistance: Min guard Gait Distance (Feet): 188 Feet Assistive  device: Rolling walker (2 wheels) Gait Pattern/deviations: Step-to pattern;Step-through pattern;Decreased stride length;Trunk flexed;Antalgic;Decreased weight shift to left Gait velocity: Decreased     General Gait Details: Slow, antalgic gait with RW and intermittent min guard for balance; cues for heel-to-toe gait pattern for increased L knee extension, pt able to correct well with cues although painful   Stairs             Wheelchair Mobility    Modified Rankin (Stroke Patients Only)       Balance Overall balance assessment: Needs assistance Sitting-balance support: No upper extremity supported;Feet supported Sitting balance-Leahy Scale: Good     Standing balance support: Bilateral upper extremity supported;Reliant on assistive device for balance;No upper extremity supported Standing balance-Leahy Scale: Fair Standing balance comment: can static stand without UE support; encouraged single UE support when performing posterior pericare standing at toilet                            Cognition Arousal/Alertness: Awake/alert Behavior During Therapy: WFL for tasks assessed/performed Overall Cognitive Status: Within Functional Limits for tasks assessed                                          Exercises General Exercises - Lower Extremity Ankle Circles/Pumps: AROM;Left;Seated Quad Sets: AROM;Left;Seated    General Comments General comments (skin integrity, edema, etc.): Pt c/o mild nausea and feeling "loopy" although improved from yesterday's session; suspect related to pain meds and lack of appetite. Unsuccessful attempt to have BM  Pertinent Vitals/Pain Pain Assessment: Faces Faces Pain Scale: Hurts even more Pain Location: L knee Pain Intervention(s): Monitored during session;Limited activity within patient's tolerance;Premedicated before session    Home Living                          Prior Function            PT  Goals (current goals can now be found in the care plan section) Progress towards PT goals: Progressing toward goals    Frequency    7X/week      PT Plan Current plan remains appropriate    Co-evaluation              AM-PAC PT "6 Clicks" Mobility   Outcome Measure  Help needed turning from your back to your side while in a flat bed without using bedrails?: A Little Help needed moving from lying on your back to sitting on the side of a flat bed without using bedrails?: A Little Help needed moving to and from a bed to a chair (including a wheelchair)?: A Little Help needed standing up from a chair using your arms (e.g., wheelchair or bedside chair)?: A Little Help needed to walk in hospital room?: A Little Help needed climbing 3-5 steps with a railing? : A Little 6 Click Score: 18    End of Session Equipment Utilized During Treatment: Gait belt Activity Tolerance: Patient tolerated treatment well Patient left: in chair;with call bell/phone within reach Nurse Communication: Mobility status PT Visit Diagnosis: Other abnormalities of gait and mobility (R26.89);Muscle weakness (generalized) (M62.81);Pain Pain - Right/Left: Left Pain - part of body: Knee     Time: [id number] PT Time Calculation (min) (ACUTE ONLY): 37 min  Charges:  $Gait Training: 8-22 mins $Therapeutic Activity: 8-22 mins                     Mabeline Caras, PT, DPT Acute Rehabilitation Services  Pager [phone] Office [place] 10/12/2021, 11:32 AM

## 2021-10-14 ENCOUNTER — Encounter (HOSPITAL_COMMUNITY): Payer: Self-pay | Admitting: Orthopedic Surgery

## 2021-10-18 ENCOUNTER — Other Ambulatory Visit: Payer: Self-pay | Admitting: Orthopedic Surgery

## 2021-10-23 MED ORDER — OXYCODONE-ACETAMINOPHEN 5-325 MG PO TABS: 1.0000 | ORAL_TABLET | ORAL | 0 refills | Status: AC | PRN

## 2021-10-23 NOTE — Telephone Encounter (Signed)
S/p left total knee 10/11/2021

## 2021-10-24 ENCOUNTER — Ambulatory Visit (INDEPENDENT_AMBULATORY_CARE_PROVIDER_SITE_OTHER): Payer: BC Managed Care – PPO

## 2021-10-24 ENCOUNTER — Ambulatory Visit (INDEPENDENT_AMBULATORY_CARE_PROVIDER_SITE_OTHER): Payer: BC Managed Care – PPO | Admitting: Orthopedic Surgery

## 2021-10-24 ENCOUNTER — Encounter: Payer: Self-pay | Admitting: Orthopedic Surgery

## 2021-10-24 DIAGNOSIS — G8929 Other chronic pain: Secondary | ICD-10-CM

## 2021-10-24 DIAGNOSIS — M25562 Pain in left knee: Secondary | ICD-10-CM | POA: Diagnosis not present

## 2021-10-24 DIAGNOSIS — Z96652 Presence of left artificial knee joint: Secondary | ICD-10-CM

## 2021-10-24 NOTE — Progress Notes (Signed)
Office Visit Note   Patient: Tyler [name]           Date of Birth: 05-27-1966           MRN: [id number] Visit Date: 10/24/2021              Requested by: Delilah Shan, MD No address on file PCP: Elisabeth Cara, [place]  Chief Complaint  Patient presents with   Left Knee - Routine Post Op    10/11/2021 left total knee replacement       HPI: Patient is a 56 year old gentleman who is 2-week status post left total knee arthroplasty.  Patient is currently working with home health physical therapy.  Patient states he feels great he states he is doing much better than he was with his previous cemented total knee arthroplasty.  Currently ambulating with a cane.  Assessment & Plan: Visit Diagnoses:  1. Chronic pain of left knee   2. Total knee replacement status, left     Plan: Continue with therapy continue to work on extension.  He will advance to a treadmill and a stationary bike.  Follow-Up Instructions: Return in about 4 weeks (around 11/21/2021).   Ortho Exam  Patient is alert, oriented, no adenopathy, well-dressed, normal affect, normal respiratory effort. Examination patient has range of motion from 0 to 90 degrees the incision is well-healed there is no redness no cellulitis there is a mild effusion.  He has range of motion from 0 to 90 degrees.  Imaging: XR Knee 1-2 Views Left  Result Date: 10/24/2021 2 view radiographs of the left knee shows a stable total knee arthroplasty press-fit without complicating features  No images are attached to the encounter.  Labs: No results found for: HGBA1C, ESRSEDRATE, CRP, LABURIC, REPTSTATUS, GRAMSTAIN, CULT, LABORGA   Lab Results  Component Value Date   ALBUMIN 3.6 06/13/2015    No results found for: MG No results found for: VD25OH  No results found for: PREALBUMIN CBC EXTENDED Latest Ref Rng & Units 10/08/2021 08/15/2019 06/13/2015  WBC 4.0 - 10.5 K/uL 5.9 4.6 5.8  RBC 4.22 - 5.81 MIL/uL 4.54 4.60 4.54  HGB  13.0 - 17.0 g/dL 14.1 14.0 13.9  HCT 39.0 - 52.0 % 40.8 42.0 41.5  PLT 150 - 400 K/uL 230 196 194     There is no height or weight on file to calculate BMI.  Orders:  Orders Placed This Encounter  Procedures   XR Knee 1-2 Views Left   No orders of the defined types were placed in this encounter.    Procedures: No procedures performed  Clinical Data: No additional findings.  ROS:  All other systems negative, except as noted in the HPI. Review of Systems  Objective: Vital Signs: There were no vitals taken for this visit.  Specialty Comments:  No specialty comments available.  PMFS History: Patient Active Problem List   Diagnosis Date Noted   Total knee replacement status, left 10/11/2021   Primary osteoarthritis of one knee, left    Arthritis of right knee 08/17/2019   Past Medical History:  Diagnosis Date   Arthritis    COVID 2020   no symptoms   Hypertension    Hypothyroid    Pneumonia     Family History  Problem Relation Age of Onset   Heart attack Father     Past Surgical History:  Procedure Laterality Date   HAND TENDON SURGERY Left    QUADRICEPS TENDON REPAIR Left 06/13/2015  Procedure: REPAIR LEFT QUADRICEP TENDON;  Surgeon: Newt Minion, MD;  Location: [facility];  Service: Orthopedics;  Laterality: Left;   TOTAL KNEE ARTHROPLASTY Right 08/17/2019   TOTAL KNEE ARTHROPLASTY Right 08/17/2019   Procedure: RIGHT TOTAL KNEE ARTHROPLASTY;  Surgeon: Newt Minion, MD;  Location: [facility];  Service: Orthopedics;  Laterality: Right;   TOTAL KNEE ARTHROPLASTY Left 10/11/2021   Procedure: Left TOTAL KNEE ARTHROPLASTY;  Surgeon: Newt Minion, MD;  Location: [facility];  Service: Orthopedics;  Laterality: Left;   WISDOM TOOTH EXTRACTION     2   Social History   Occupational History   Not on file  Tobacco Use   Smoking status: Former    Types: Cigarettes    Quit date: 06/12/1995    Years since quitting: 26.3   Smokeless tobacco: Never  Vaping Use   Vaping Use:  Never used  Substance and Sexual Activity   Alcohol use: Yes    Comment: 6 drinks/week   Drug use: No   Sexual activity: Not on file

## 2021-11-08 ENCOUNTER — Telehealth: Payer: Self-pay | Admitting: Orthopedic Surgery

## 2021-11-08 NOTE — Telephone Encounter (Signed)
Noted pt has f/u on 11/21/21

## 2021-11-08 NOTE — Telephone Encounter (Signed)
[facility] called and states that pt has been discharge from home health.   Cb [phone]

## 2021-11-21 ENCOUNTER — Encounter: Payer: BC Managed Care – PPO | Admitting: Orthopedic Surgery

## 2021-12-04 ENCOUNTER — Encounter: Payer: Self-pay | Admitting: Family

## 2021-12-04 ENCOUNTER — Ambulatory Visit (INDEPENDENT_AMBULATORY_CARE_PROVIDER_SITE_OTHER): Payer: BC Managed Care – PPO | Admitting: Family

## 2021-12-04 DIAGNOSIS — Z96652 Presence of left artificial knee joint: Secondary | ICD-10-CM

## 2021-12-04 NOTE — Progress Notes (Signed)
? ?  Post-Op Visit Note ?  ?Patient: Tyler [name]           ?Date of Birth: 11-14-1965           ?MRN: [id number] ?Visit Date: 12/04/2021 ?PCP: Elisabeth Cara, PA-C ? ?Chief Complaint:  ?Chief Complaint  ?Patient presents with  ? Left Knee - Routine Post Op  ?  10/11/21 left total knee replacement  ? ? ?HPI:  ?HPI ?The patient is a 56 year old gentleman who presents 6 weeks status post left total knee arthroplasty.  He is quite pleased with his progress he has begun using a stationary bicycle, the elliptical.  He has returned to the weight room at the gym and is doing lower extremity weightlifting as well with the machines he is not having any concerns of pain. ?Ortho Exam ?On examination of the left knee the incision is well-healed there is minimal edema no erythema.  He does have fluid range of motion from 0-1 20. ? ?Visit Diagnoses:  ?1. Total knee replacement status, left   ? ? ?Plan: He will continue to advance his activities as tolerated follow-up in the office as needed. ? ?Follow-Up Instructions: Return if symptoms worsen or fail to improve.  ? ?Imaging: ?No results found. ? ?Orders:  ?No orders of the defined types were placed in this encounter. ? ?No orders of the defined types were placed in this encounter. ? ? ? ?PMFS History: ?Patient Active Problem List  ? Diagnosis Date Noted  ? Total knee replacement status, left 10/11/2021  ? Primary osteoarthritis of one knee, left   ? Arthritis of right knee 08/17/2019  ? ?Past Medical History:  ?Diagnosis Date  ? Arthritis   ? COVID 2020  ? no symptoms  ? Hypertension   ? Hypothyroid   ? Pneumonia   ?  ?Family History  ?Problem Relation Age of Onset  ? Heart attack Father   ?  ?Past Surgical History:  ?Procedure Laterality Date  ? HAND TENDON SURGERY Left   ? QUADRICEPS TENDON REPAIR Left 06/13/2015  ? Procedure: REPAIR LEFT QUADRICEP TENDON;  Surgeon: Newt Minion, MD;  Location: [facility];  Service: Orthopedics;  Laterality: Left;  ? TOTAL KNEE  ARTHROPLASTY Right 08/17/2019  ? TOTAL KNEE ARTHROPLASTY Right 08/17/2019  ? Procedure: RIGHT TOTAL KNEE ARTHROPLASTY;  Surgeon: Newt Minion, MD;  Location: [facility];  Service: Orthopedics;  Laterality: Right;  ? TOTAL KNEE ARTHROPLASTY Left 10/11/2021  ? Procedure: Left TOTAL KNEE ARTHROPLASTY;  Surgeon: Newt Minion, MD;  Location: [facility];  Service: Orthopedics;  Laterality: Left;  ? WISDOM TOOTH EXTRACTION    ? 2  ? ?Social History  ? ?Occupational History  ? Not on file  ?Tobacco Use  ? Smoking status: Former  ?  Types: Cigarettes  ?  Quit date: 06/12/1995  ?  Years since quitting: 26.4  ? Smokeless tobacco: Never  ?Vaping Use  ? Vaping Use: Never used  ?Substance and Sexual Activity  ? Alcohol use: Yes  ?  Comment: 6 drinks/week  ? Drug use: No  ? Sexual activity: Not on file  ? ? ?

## 2024-08-11 ENCOUNTER — Emergency Department

## 2024-08-11 ENCOUNTER — Emergency Department
Admission: EM | Admit: 2024-08-11 | Discharge: 2024-08-11 | Disposition: A | Discharge status: Home / Self Care | Attending: Emergency Medicine | Admitting: Emergency Medicine

## 2024-08-11 ENCOUNTER — Other Ambulatory Visit: Payer: Self-pay

## 2024-08-11 DIAGNOSIS — Z96659 Presence of unspecified artificial knee joint: Secondary | ICD-10-CM | POA: Diagnosis not present

## 2024-08-11 DIAGNOSIS — S43014A Anterior dislocation of right humerus, initial encounter: Secondary | ICD-10-CM | POA: Diagnosis not present

## 2024-08-11 DIAGNOSIS — S4991XA Unspecified injury of right shoulder and upper arm, initial encounter: Secondary | ICD-10-CM | POA: Diagnosis present

## 2024-08-11 DIAGNOSIS — X509XXA Other and unspecified overexertion or strenuous movements or postures, initial encounter: Secondary | ICD-10-CM | POA: Diagnosis not present

## 2024-08-11 MED ORDER — FENTANYL CITRATE (PF) 50 MCG/ML IJ SOSY
50.0000 ug | PREFILLED_SYRINGE | Freq: Once | INTRAMUSCULAR | Status: AC
  Administered 2024-08-11: 50 ug via INTRAVENOUS
  Filled 2024-08-11: qty 1

## 2024-08-11 NOTE — ED Provider Notes (Signed)
 [facility] Provider Note    Event Date/Time   First MD Initiated Contact with Patient 08/11/24 1511     (approximate)   History   Arm Injury   HPI  Tyler [name] is a 58 year old male presenting to the emergency department for evaluation of shoulder injury.  Patient without pain to this call.  He went to find his desk and reached over and felt his arm pop out of place.  Did not fall onto the arm or hit his head.  Denies injury to other areas.  Has not previously dislocated his shoulder.  Does see an orthopedic doctor for 2 prior knee replacements.     Physical Exam   Triage Vital Signs: ED Triage Vitals  Encounter Vitals Group     BP 08/11/24 1437 (!) 146/109     Girls Systolic BP Percentile --      Girls Diastolic BP Percentile --      Boys Systolic BP Percentile --      Boys Diastolic BP Percentile --      Pulse Rate 08/11/24 1437 79     Resp 08/11/24 1437 16     Temp 08/11/24 1437 97.8 F (36.6 C)     Temp Source 08/11/24 1437 Oral     SpO2 08/11/24 1437 95 %     Weight 08/11/24 1435 220 lb 0.3 oz (99.8 kg)     Height --      Head Circumference --      Peak Flow --      Pain Score 08/11/24 1435 8     Pain Loc --      Pain Education --      Exclude from Growth Chart --     Most recent vital signs: Vitals:   08/11/24 1437  BP: (!) 146/109  Pulse: 79  Resp: 16  Temp: 97.8 F (36.6 C)  SpO2: 95%     General: Awake, interactive, appears uncomfortable CV:  Good peripheral perfusion Resp:  Unlabored respirations Abd:  Nondistended.  Neuro:  Symmetric facial movement, fluid speech Other:   There is asymmetry of the bilateral shoulders with palpable deformity along the right shoulder.  No tenderness over the full apical.  No tenderness along the elbow, wrist.  Intact sensation throughout extremity.  2+ radial pulses bilaterally.  No deformity over the remainder of extremities.  Head atraumatic.   ED Results / Procedures /  Treatments   Labs (all labs ordered are listed, but only abnormal results are displayed) Labs Reviewed - No data to display   EKG EKG independently reviewed and interpreted by myself demonstrates:    RADIOLOGY Imaging independently reviewed and interpreted by myself demonstrates:  Initial shoulder x-Rudolf Blizard demonstrates anterior dislocation of the right shoulder Postreduction shoulder x-Jovana Rembold demonstrates adequate alignment, Hill-Sachs deformity noted  Formal Radiology Read:  DG Shoulder Right Result Date: 08/11/2024 EXAM: 1 VIEW XRAY OF THE RIGHT SHOULDER 08/11/2024 04:35:00 PM COMPARISON: None available. CLINICAL HISTORY: post reduction FINDINGS: BONES AND JOINTS: Status post reduction of right shoulder anterior dislocation with appropriate alignment. Hill-Sachs impaction deformity. The [facility] joint is unremarkable in appearance. SOFT TISSUES: No abnormal calcifications. Visualized lung is unremarkable. IMPRESSION: 1. Successful reduction of the right glenohumeral dislocation. 2. Hill-Sachs impaction fracture of the humeral head. Electronically signed by: Norman Gatlin MD 08/11/2024 04:49 PM EST RP Workstation: [id number]   DG Shoulder Right Result Date: 08/11/2024 CLINICAL DATA:  Fall and trauma to the right shoulder. EXAM: RIGHT SHOULDER - 2+  VIEW COMPARISON:  None Available. FINDINGS: Anterior dislocation of the right shoulder. No definite acute fracture. The soft tissues are unremarkable. IMPRESSION: Anterior dislocation of the right shoulder. Electronically Signed   By: Vanetta Chou M.D.   On: 08/11/2024 15:21    PROCEDURES:  Critical Care performed: No  .Reduction of dislocation  Date/Time: 08/11/2024 5:47 PM  Performed by: Levander Slate, MD Authorized by: Levander Slate, MD  Consent: Verbal consent obtained Risks and benefits: risks, benefits and alternatives were discussed Consent given by: patient Patient identity confirmed: verbally with patient Local anesthesia used:  no  Anesthesia: Local anesthesia used: no  Sedation: Patient sedated: no  Patient tolerance: patient tolerated the procedure well with no immediate complications Comments: Right shoulder dislocation reduction using Cunningham technique and scapular massage with successful reduction      MEDICATIONS ORDERED IN ED: Medications  fentaNYL  (SUBLIMAZE ) injection 50 mcg (50 mcg Intravenous Given 08/11/24 1556)     IMPRESSION / MDM / ASSESSMENT AND PLAN / ED COURSE  I reviewed the triage vital signs and the nursing notes.  Differential diagnosis includes, but is not limited to, fracture, dislocation, soft tissue injury, no evidence of neurovascular injury  Patient's presentation is most consistent with acute complicated illness / injury requiring diagnostic workup.  58 year old male presenting with shoulder pain.  X-Lason Eveland does demonstrate a right shoulder dislocation.  Patient was given fentanyl  for pain control and reduction was performed as above.  Postreduction x-Shawntae Lowy did demonstrate adequate alignment.  Patient without new complaints on reevaluation.  He is comfortable discharge home.  Discharged with a sling, will follow-up with his orthopedic team as needed.  Strict return precautions provided.  Patient discharged stable condition.      FINAL CLINICAL IMPRESSION(S) / ED DIAGNOSES   Final diagnoses:  Anterior dislocation of right shoulder, initial encounter     Rx / DC Orders   ED Discharge Orders     None        Note:  This document was prepared using Dragon voice recognition software and may include unintentional dictation errors.   Levander Slate, MD 08/11/24 [phone]

## 2024-08-11 NOTE — ED Notes (Signed)
 Arm sling applied to right arm. Ice pack to right shoulder and arm positioned with blankets for comfort.

## 2024-08-11 NOTE — Discharge Instructions (Addendum)
 You are seen in the ER today for shoulder pain.  Your shoulder was dislocated but we were able to put this back in place.  Contact your orthopedic doctor to see if they would like to follow-up with you in the clinic.  Wear your sling for at least a week but range your arm a few times a day to prevent stiffness from developing.  Return to the ER for new or worsening symptoms.

## 2024-08-11 NOTE — ED Triage Notes (Signed)
 C/O right shoulder and upper arm pain..  Patient fell while playing disc golf.

## 2024-08-23 ENCOUNTER — Ambulatory Visit (INDEPENDENT_AMBULATORY_CARE_PROVIDER_SITE_OTHER): Admitting: Orthopaedic Surgery

## 2024-08-23 ENCOUNTER — Encounter: Payer: Self-pay | Admitting: Orthopaedic Surgery

## 2024-08-23 ENCOUNTER — Other Ambulatory Visit: Payer: Self-pay

## 2024-08-23 DIAGNOSIS — S43014A Anterior dislocation of right humerus, initial encounter: Secondary | ICD-10-CM

## 2024-08-23 NOTE — Progress Notes (Signed)
 Office Visit Note   Patient: Tyler [name]           Date of Birth: 09-Jan-1966           MRN: [id number] Visit Date: 08/23/2024              Requested by: Boneta Belvia BRAVO, PA-C [address] [place],  [place] [address] PCP: Boneta, [place]  E, PA-C   Assessment & Plan: Visit Diagnoses:  1. Anterior shoulder dislocation, right, initial encounter     Plan: History of Present Illness Tyler [name] is a 58 year old male who presents with a recent right shoulder dislocation.  He experienced a shoulder dislocation on November 6th while playing disc golf. The dislocation occurred when he fell while using a tree for support. This is his first dislocation, and the shoulder was relocated on the same day.  Since the dislocation, he experiences pain in the shoulder, particularly when attempting to raise his arm. He limits arm movements to below shoulder level to avoid pain. He has some motion but is cautious about the range of movements.  Physical Exam MUSCULOSKELETAL: Significant weakness of the rotator cuff. Weakness and pain on resisted external rotation of the shoulder.  Assessment and Plan Right shoulder dislocation, status post reduction with suspected traumatic rotator cuff tear Suspected rotator cuff tear due to significant weakness post-reduction. MRI required for confirmation. - Ordered MRI of right shoulder. - Advised to avoid reaching above shoulder level and limit arm movement to waist level. - Will discuss surgical options if MRI confirms tear.  Follow-Up Instructions: No follow-ups on file.   Orders:  No orders of the defined types were placed in this encounter.  No orders of the defined types were placed in this encounter.     Procedures: No procedures performed   Clinical Data: No additional findings.   Subjective: Chief Complaint  Patient presents with   Right Shoulder - Pain    DOI 08/11/2024    HPI  Review of  Systems  Constitutional: Negative.   HENT: Negative.    Eyes: Negative.   Respiratory: Negative.    Cardiovascular: Negative.   Gastrointestinal: Negative.   Endocrine: Negative.   Genitourinary: Negative.   Skin: Negative.   Allergic/Immunologic: Negative.   Neurological: Negative.   Hematological: Negative.   Psychiatric/Behavioral: Negative.    All other systems reviewed and are negative.    Objective: Vital Signs: There were no vitals taken for this visit.  Physical Exam Vitals and nursing note reviewed.  Constitutional:      Appearance: He is well-developed.  HENT:     Head: Normocephalic and atraumatic.  Eyes:     Pupils: Pupils are equal, round, and reactive to light.  Pulmonary:     Effort: Pulmonary effort is normal.  Abdominal:     Palpations: Abdomen is soft.  Musculoskeletal:        General: Normal range of motion.     Cervical back: Neck supple.  Skin:    General: Skin is warm.  Neurological:     Mental Status: He is alert and oriented to person, place, and time.  Psychiatric:        Behavior: Behavior normal.        Thought Content: Thought content normal.        Judgment: Judgment normal.     Ortho Exam  Specialty Comments:  No specialty comments available.  Imaging: No results found.   PMFS History: Patient Active  Problem List   Diagnosis Date Noted   Anterior shoulder dislocation, right, initial encounter 08/23/2024   Total knee replacement status, left 10/11/2021   Primary osteoarthritis of one knee, left    Arthritis of right knee 08/17/2019   Past Medical History:  Diagnosis Date   Arthritis    COVID 2020   no symptoms   Hypertension    Hypothyroid    Pneumonia     Family History  Problem Relation Age of Onset   Heart attack Father     Past Surgical History:  Procedure Laterality Date   HAND TENDON SURGERY Left    QUADRICEPS TENDON REPAIR Left 06/13/2015   Procedure: REPAIR LEFT QUADRICEP TENDON;  Surgeon: Jerona Harden GAILS, MD;  Location: [facility] OR;  Service: Orthopedics;  Laterality: Left;   TOTAL KNEE ARTHROPLASTY Right 08/17/2019   TOTAL KNEE ARTHROPLASTY Right 08/17/2019   Procedure: RIGHT TOTAL KNEE ARTHROPLASTY;  Surgeon: Harden Jerona GAILS, MD;  Location: [facility] OR;  Service: Orthopedics;  Laterality: Right;   TOTAL KNEE ARTHROPLASTY Left 10/11/2021   Procedure: Left TOTAL KNEE ARTHROPLASTY;  Surgeon: Harden Jerona GAILS, MD;  Location: [facility] OR;  Service: Orthopedics;  Laterality: Left;   WISDOM TOOTH EXTRACTION     2   Social History   Occupational History   Not on file  Tobacco Use   Smoking status: Former    Current packs/day: 0.00    Types: Cigarettes    Quit date: 06/12/1995    Years since quitting: 29.2   Smokeless tobacco: Never  Vaping Use   Vaping status: Never Used  Substance and Sexual Activity   Alcohol use: Yes    Comment: 6 drinks/week   Drug use: No   Sexual activity: Not on file

## 2024-09-09 ENCOUNTER — Encounter: Payer: Self-pay | Admitting: Orthopaedic Surgery

## 2024-09-14 ENCOUNTER — Other Ambulatory Visit

## 2024-09-21 ENCOUNTER — Ambulatory Visit: Admitting: Orthopaedic Surgery
